# Patient Record
Sex: Female | Born: 1980 | Race: White | Hispanic: No | Marital: Married
Health system: Southern US, Community
[De-identification: ages and names within clinical notes are randomized; demographics above are authoritative.]

## PROBLEM LIST (undated history)

## (undated) DIAGNOSIS — F419 Anxiety disorder, unspecified: Secondary | ICD-10-CM

## (undated) DIAGNOSIS — F909 Attention-deficit hyperactivity disorder, unspecified type: Secondary | ICD-10-CM

## (undated) HISTORY — PX: TONSILLECTOMY: SUR1361

## (undated) HISTORY — PX: ABLATION: SHX5711

## (undated) HISTORY — PX: CHOLECYSTECTOMY: SHX55

## (undated) HISTORY — DX: Attention-deficit hyperactivity disorder, unspecified type: F90.9

## (undated) HISTORY — DX: Anxiety disorder, unspecified: F41.9

## (undated) HISTORY — PX: NASAL SINUS SURGERY: SHX719

---

## 2016-07-20 DIAGNOSIS — F419 Anxiety disorder, unspecified: Secondary | ICD-10-CM | POA: Insufficient documentation

## 2016-07-20 DIAGNOSIS — E041 Nontoxic single thyroid nodule: Secondary | ICD-10-CM | POA: Insufficient documentation

## 2017-08-18 ENCOUNTER — Ambulatory Visit (INDEPENDENT_AMBULATORY_CARE_PROVIDER_SITE_OTHER): Payer: Commercial Managed Care - PPO

## 2017-08-18 ENCOUNTER — Encounter: Payer: Self-pay | Admitting: Sports Medicine

## 2017-08-18 ENCOUNTER — Ambulatory Visit (INDEPENDENT_AMBULATORY_CARE_PROVIDER_SITE_OTHER): Payer: Commercial Managed Care - PPO | Admitting: Sports Medicine

## 2017-08-18 DIAGNOSIS — S93501A Unspecified sprain of right great toe, initial encounter: Secondary | ICD-10-CM

## 2017-08-18 DIAGNOSIS — M79674 Pain in right toe(s): Secondary | ICD-10-CM

## 2017-08-18 DIAGNOSIS — S93509A Unspecified sprain of unspecified toe(s), initial encounter: Secondary | ICD-10-CM

## 2017-08-18 NOTE — Progress Notes (Signed)
Subjective: Rhonda ShearerMelanie Hurst is a 37 y.o. female patient who presents to office for evaluation of righ foot pain. Patient states that when she called for the appointment her toe is hurting but it is not hurting anymore.  States that the whole entire first toe on the right foot was hurting for 1 week and was sharp when she would stand or try to do any type of running patient reported pain at that time 6 out of 10 but no longer hurts has treated the area with Advil icing resting taping and Epson salt soaks.  Patient states that she had this to flare up twice this year last time earlier this spring when she was attempting to run states that since she has cut back and has changed her shoes and went up in one size which has given her some improvement without recurrence of pain.  Patient states that the first time she has ran was on last week and she ran without any pain but wanted to have her big toe checked because she wanted to see if there is anything wrong.  Patient denies any other pedal complaints. Denies injury/trip/fall/sprain/any causative factors.   Review of Systems  Musculoskeletal: Positive for joint pain.  All other systems reviewed and are negative.    There are no active problems to display for this patient.   No current outpatient medications on file prior to visit.   No current facility-administered medications on file prior to visit.     Allergies  Allergen Reactions  . Sulfa Antibiotics   . Sulfamethoxazole Rash and Hives    Objective:  General: Alert and oriented x3 in no acute distress  Dermatology: No open lesions bilateral lower extremities, no webspace macerations, no ecchymosis bilateral, all nails x 10 are well manicured.  Vascular: Dorsalis Pedis and Posterior Tibial pedal pulses palpable, Capillary Fill Time 3 seconds,(+) pedal hair growth bilateral, no edema bilateral lower extremities, Temperature gradient within normal limits.  Neurology: Michaell CowingGross sensation intact  via light touch bilateral.  Musculoskeletal: No reproducible tenderness with palpation at right great toe,No pain with calf compression bilateral. There is decreased ankle rom with knee extending  vs flexed resembling gastroc equnius bilateral, Subtalar joint range of motion is within normal limits, there is no 1st ray hypermobility noted bilateral, early decreased 1st MPJ rom Right>Left with functional limitus and expenses noted on weightbearing exam. Strength within normal limits in all groups bilateral.   Gait:Non- Antalgic gait  Xrays  Right foot   Impression: Normal osseous mineralization there is bipartite sesamoid no other acute findings noted.  Assessment and Plan: Problem List Items Addressed This Visit    None    Visit Diagnoses    Toe pain, right    -  Primary   Relevant Orders   DG Foot Complete Right   Toe sprain, initial encounter           -Complete examination performed -Xrays reviewed -Discussed treatement options for turf toe -Recommend patient to avoid strenuous activities that would cause aggravation to the toe and for running to run in smaller increments and to cross train during the week to avoid reinjury of the big toe joint -Provided toe caps for patient to use when running to cushion first toes and advised patient to use good supportive shoes if Pain continues advised patient to look into getting super feet orthotics and to return to office for further evaluation -May continue with Advil as needed icing Epson salt and strapping or taping -Patient  to return to office as needed or sooner if condition worsens.  Asencion Islam, DPM

## 2017-08-18 NOTE — Progress Notes (Signed)
   Subjective:    Patient ID: Rhonda ShearerMelanie Hurst, female    DOB: [birth year], 37 y.o.   MRN: [id number]  HPI    Review of Systems  Musculoskeletal: Positive for arthralgias, joint swelling and myalgias.  All other systems reviewed and are negative.      Objective:   Physical Exam        Assessment & Plan:

## 2017-08-18 NOTE — Patient Instructions (Signed)
Turf Toe Turf toe is an injury that affects the joint at the base of the big toe. It occurs when the toe is bent upward by force and extended beyond its normal limits (hyperextension). The joint of the big toe is surrounded by tissues (ligaments and tendons) that help to keep it in place. If any of these tissues are damaged, turf toe may result. Turf toe is a common sports injury. It can be mild if the tissue was stretched. It may be more severe if the tissue was partially or completely torn. Early treatment usually results in good recovery. In some cases, a person may continue to have some pain, joint stiffness, and reduced ability to push off from the affected foot. What are the causes? This injury is caused by extreme upward bending of the big toe joint. It can occur when:  Your toe is pressed flat to the ground and your heel is raised while you push off forcefully with the front of your foot. For example, this could happen when you begin a sprint.  You push off on the ball of your foot repeatedly while running or jumping, especially on hard surfaces such as a basketball court.  You jam your toe from a force pushing into the toe.  What increases the risk? This injury is more likely to occur in people:  Who wear flexible shoes that do not offer good support while running or jumping.  Who participate in activities or sports that involve running and jumping on turf or hard surfaces, such as: ? Soccer. ? Football. ? Basketball. ? Volleyball. ? Gymnastics. ? Dancing. ? Wrestling.  What are the signs or symptoms? Symptoms of this condition include:  Pain at the base of the toe.  Swelling at the base of the toe.  Stiffness.  Limited movement of the toe.  Bruising.  If turf toe is the result of a direct injury, symptoms may appear suddenly. If the condition is due to repetitive movements, such as running and jumping, the symptoms may develop gradually and worsen over time. How is  this diagnosed? This condition may be diagnosed based on your symptoms, your medical history, and a physical exam of your foot. During the exam, the health care provider will check the range of motion of your toe by moving it up and down and from side to side. You may also have tests to confirm the diagnosis, such as:  X-rays to rule out bone problems, such as a fracture, a chip, or bones that are out of alignment.  MRI to view the soft tissue and cartilage in your toe. This will help to determine how severe your injury is.  How is this treated? Treatment for this condition depends on the severity of the injury. Treatment may include:  Rest, ice, compression, and elevation. This is often called the RICE strategy. It may be recommended if the injury is mild. Your health care provider may also restrict movement of your toe by taping it to the smaller toes.  Wearing a walking boot or a cast. For more serious turf toe, you may need to wear a walking boot for about one week. This will keep your toe from moving (immobilization). If you have severe turf toe, you may need to wear a cast or a walking boot for several weeks.  Over-the-counter medicines to relieve pain.  Physical therapy. Stretching and strengthening exercises can help to reduce or prevent joint stiffness in your toe.  Surgery. In rare cases, surgery  may be needed for a severe injury if pain does not go away.  Follow these instructions at home: Managing pain, stiffness, and swelling  If directed, apply ice to the injured area: ? Put ice in a plastic bag. ? Place a towel between your skin and the bag. ? Leave the ice on for 20 minutes, 2-3 times per day.  Move your toes often to avoid stiffness and to lessen swelling.  Wear an elastic compression bandage to help prevent or lessen swelling.  Raise (elevate) your foot above the level of your heart while you are sitting or lying down.  If you have a walking boot, wear it as told  by your health care provider.  Do not use the injured foot to support (bear) your body weight until your health care provider says that you can. Use crutches as told by your health care provider. Medicines  Take over-the-counter and prescription medicines only as told by your health care provider.  Do not drive or operate heavy machinery while taking prescription pain medicine. If you have a cast:  Do not put pressure on any part of the cast until it is fully hardened. This may take several hours.  Do not stick anything inside the cast to scratch your skin. Doing that increases your risk of infection.  Check the skin around the cast every day. Report any concerns to your health care provider. You may put lotion on dry skin around the edges of the cast. Do not apply lotion to the skin underneath the cast.  Do not let your cast get wet if it is not waterproof.  Keep the cast clean. General instructions  If your cast is not waterproof, cover it with a watertight plastic bag when you take a bath or a shower.  Return to your normal activities as told by your health care provider. Ask your health care provider what activities are safe for you.  Switch to less-flexible, more supportive footwear as told by your health care provider. Rigid shoe inserts (orthotics) can also reduce stress on your toes and improve stability.  Keep all follow-up visits as told by your health care provider. This is important. Contact a health care provider if:  You have new bruising or swelling in your toe.  The pain in your toe gets worse.  Your pain medicine is not helping. Get help right away if:  Your cast or walking boot becomes loose or damaged.  Your pain becomes severe.  Your toe becomes numb or changes color.  Your toe joint feels unstable or is unable to bear any weight. This information is not intended to replace advice given to you by your health care provider. Make sure you discuss any  questions you have with your health care provider. Document Released: 06/18/2001 Document Revised: 08/30/2015 Document Reviewed: 07/09/2014 Elsevier Interactive Patient Education  2018 [employer]  Turf Toe Rehab Ask your health care provider which exercises are safe for you. Do exercises exactly as told by your health care provider and adjust them as directed. It is normal to feel mild stretching, pulling, tightness, or discomfort as you do these exercises, but you should stop right away if you feel sudden pain or your pain gets worse. Do not begin these exercises until told by your health care provider. Stretching and range of motion exercises These exercises warm up your muscles and joints and improve the movement and flexibility of your foot. These exercises also help to relieve pain. Exercise A: Toe  flexion  1. Sit with your left / rightleg crossed over your opposite knee. 2. Gently pull your big toe toward the bottom of your foot. You should feel a stretch on the top of your toe and foot. 3. Hold this position for __________ seconds. 4. Release your toe and return to the starting position. Repeat __________ times. Complete this exercise __________ times a day. Exercise B: Gastroc, standing  1. Stand with your hands against a wall. 2. Extend your left / right leg behind you, and bend your front knee slightly. Your heels should be on the floor. 3. Keeping your heels on the floor, keep your back knee straight and shift your weight toward the wall. You should feel a gentle stretch in the back of your lower leg (calf). 4. Hold this position for __________ seconds. 5. Return to the starting position. Repeat __________ times. Complete this exercise __________ times a day. Exercise C: Soleus, standing 1. Stand with your hands against a wall. 2. Extend your left / right leg behind you, and bend your front knee slightly. Your heels should be on the floor. 3. Keeping your heels on the floor,  bend your back knee and shift your weight slightly over your back leg. You should feel a gentle stretch deep in your calf. 4. Hold this position for __________ seconds. 5. Return to the starting position. Repeat __________ times. Complete this exercise __________ times a day. Strengthening exercises These exercises build strength and endurance in your foot. Endurance is the ability to use your muscles for a long time, even after they get tired. Exercise D: Towel curls ( toe flexors) 1. Sit in a chair on a non-carpeted surface, and put your feet on the floor. 2. Place a towel in front of your feet. If told by your health care provider, add __________ to the end of the towel. 3. Keeping your heel on the floor, put your left / right foot on the towel. 4. Pull the towel toward your heel by grabbing the towel with your left / right toes and curling them under. Keep your heel on the floor while you do this. 5. Let your toes relax. 6. Grab the towel again. Keep going until the towel is completely underneath your foot. Repeat __________ times. Complete this exercise __________ times a day. Exercise E: Arch lifts ( foot intrinsics) 1. Sit in a chair with your feet flat on the floor. 2. Keeping your big toe and your heel on the floor, lift only your arch, which is on the inner edge of your left / right foot. Do not move your knees or scrunch your toes. This is a small movement. 3. Hold this position for __________ seconds. 4. Slowly return to the starting position. Repeat __________ times. Complete this exercise __________ times a day. This information is not intended to replace advice given to you by your health care provider. Make sure you discuss any questions you have with your health care provider. Document Released: 12/27/2004 Document Revised: 09/03/2015 Document Reviewed: 11/10/2014 Elsevier Interactive Patient Education  2018 [employer].

## 2017-08-24 ENCOUNTER — Other Ambulatory Visit: Payer: Self-pay | Admitting: Sports Medicine

## 2017-08-24 DIAGNOSIS — S93509A Unspecified sprain of unspecified toe(s), initial encounter: Secondary | ICD-10-CM

## 2017-08-24 DIAGNOSIS — M79674 Pain in right toe(s): Secondary | ICD-10-CM

## 2018-10-30 ENCOUNTER — Ambulatory Visit (INDEPENDENT_AMBULATORY_CARE_PROVIDER_SITE_OTHER): Payer: Commercial Managed Care - PPO | Admitting: Cardiology

## 2018-10-30 ENCOUNTER — Encounter: Payer: Self-pay | Admitting: Cardiology

## 2018-10-30 ENCOUNTER — Other Ambulatory Visit: Payer: Self-pay

## 2018-10-30 VITALS — BP 102/78 | HR 69 | Ht 61.0 in | Wt 137.0 lb

## 2018-10-30 DIAGNOSIS — R0602 Shortness of breath: Secondary | ICD-10-CM

## 2018-10-30 DIAGNOSIS — E782 Mixed hyperlipidemia: Secondary | ICD-10-CM

## 2018-10-30 DIAGNOSIS — R0600 Dyspnea, unspecified: Secondary | ICD-10-CM | POA: Insufficient documentation

## 2018-10-30 NOTE — Progress Notes (Signed)
Cardiology Office Note:    Date:  10/30/2018   ID:  Rhonda Hurst, DOB 1980-03-25, MRN [id number]  PCP:  Lind Guest, NP  Cardiologist:  No primary care provider on file.  Electrophysiologist:  None   Referring MD: Lind Guest, NP   Patient referred by primary care provider for shortness of breath.  History of Present Illness:    Rhonda Hurst is a 38 y.o. female with a hx of anxiety presents to be evaluated for shortness of breath.  Patient reports that over the last several months she has been experiencing worsening shortness of breath.  She states in the beginning of this she is concerned that this was a COVID-19 virus therefore she went to the ED to be evaluated.  In the ED at [facility] her testing came back negative for Covid with chest x-ray being normal.  But she still experiencing the shortness of breath.  Given his symptoms her PCP recommended she come and see a cardiologist.  She denies any chest pain, hypertension, dizziness and fatigue.  Past Medical History:  Diagnosis Date  . Anxiety     History reviewed. No pertinent surgical history.  Current Medications: Current Meds  Medication Sig  . busPIRone (BUSPAR) 5 MG tablet Take 1 tablet by mouth daily.  . hydrOXYzine (ATARAX/VISTARIL) 25 MG tablet Take 1 tablet by mouth daily.  Marland Kitchen loratadine (CLARITIN) 10 MG tablet Take 10 mg by mouth daily.  . Melatonin 10 MG TABS Take 1 tablet by mouth daily.     Allergies:   Sulfa antibiotics and Sulfamethoxazole   Social History   Socioeconomic History  . Marital status: Married    Spouse name: Not on file  . Number of children: Not on file  . Years of education: Not on file  . Highest education level: Not on file  Occupational History  . Not on file  Social Needs  . Financial resource strain: Not on file  . Food insecurity    Worry: Not on file    Inability: Not on file  . Transportation needs    Medical: Not on file    Non-medical: Not on file   Tobacco Use  . Smoking status: Former Research scientist (life sciences)  . Smokeless tobacco: Never Used  Substance and Sexual Activity  . Alcohol use: Not on file  . Drug use: Not on file  . Sexual activity: Not on file  Lifestyle  . Physical activity    Days per week: Not on file    Minutes per session: Not on file  . Stress: Not on file  Relationships  . Social Herbalist on phone: Not on file    Gets together: Not on file    Attends religious service: Not on file    Active member of club or organization: Not on file    Attends meetings of clubs or organizations: Not on file    Relationship status: Not on file  Other Topics Concern  . Not on file  Social History Narrative  . Not on file     Family History: The patient's family history includes Colon cancer in her father; Hyperlipidemia in her mother.  ROS:   Review of Systems  Constitution: Negative for decreased appetite, fever and weight gain.  HENT: Negative for congestion, ear discharge, hoarse voice and sore throat.   Eyes: Negative for discharge, redness, vision loss in right eye and visual halos.  Cardiovascular: Negative for chest pain, dyspnea on exertion, leg swelling,  orthopnea and palpitations.  Respiratory: Reports shortness of breath.  Negative for cough, hemoptysis,  and snoring.   Endocrine: Negative for heat intolerance and polyphagia.  Hematologic/Lymphatic: Negative for bleeding problem. Does not bruise/bleed easily.  Skin: Negative for flushing, nail changes, rash and suspicious lesions.  Musculoskeletal: Negative for arthritis, joint pain, muscle cramps, myalgias, neck pain and stiffness.  Gastrointestinal: Negative for abdominal pain, bowel incontinence, diarrhea and excessive appetite.  Genitourinary: Negative for decreased libido, genital sores and incomplete emptying.  Neurological: Negative for brief paralysis, focal weakness, headaches and loss of balance.  Psychiatric/Behavioral: Negative for altered mental  status, depression and suicidal ideas.  Allergic/Immunologic: Negative for HIV exposure and persistent infections.    EKGs/Labs/Other Studies Reviewed:    The following studies were reviewed today:  EKG:  The ekg ordered today demonstrates sinus rhythm, heart rate 69 bpm, nonspecific ST changes.  No prior EKG to compare  Chest x-ray performed on September 07, 2018 at the [facility] reviewed by me no evidence of pleural effusion, or consolidation.  Essentially unremarkable/normal chest x-ray.  Recent Labs: WBC 10/27/2018: Hemoglobin 13.6, hematocrit 40.7, MCV 85, platelet 269 Chemistry: Sodium 138, potassium 3.6, chloride 102, bicarb 31, creatinine 0.80, BUN 11, will be A1c 4.7, glucose 90, AST 25, ALT 16, alk phos 78 Recent Lipid Panel Total cholesterol 2 5, triglyceride 115, LDL 117, HDL 65  Physical Exam:    VS:  BP 102/78 (BP Location: Left Arm)   Pulse 69   Ht '5\' 1"'$  (1.549 m)   Wt 137 lb (62.1 kg)   SpO2 98%   BMI 25.89 kg/m     Wt Readings from Last 3 Encounters:  10/30/18 137 lb (62.1 kg)     GEN: Well nourished, well developed in no acute distress HEENT: Normal NECK: No JVD; No carotid bruits LYMPHATICS: No lymphadenopathy CARDIAC: S1S2 noted,RRR, no murmurs, rubs, gallops RESPIRATORY:  Clear to auscultation without rales, wheezing or rhonchi  ABDOMEN: Soft, non-tender, non-distended, +bowel sounds, no guarding. EXTREMITIES: No edema, No cyanosis, no clubbing MUSCULOSKELETAL:  No edema; No deformity  SKIN: Warm and dry NEUROLOGIC:  Alert and oriented x 3, non-focal PSYCHIATRIC:  Normal affect, good insight  ASSESSMENT:    No diagnosis found. PLAN:    In order of problems listed above:  1.  I am concerned about her worsening shortness of breath.  Therefore at this time I am going to order thoracic echocardiogram to assess for RV/LV function, and any structural abnormalities.  2.  In addition have ordered pulmonary function test for the shortness of  breath.  I did recommend she also to see pulmonologist.  3.  For now she can do activities as tolerated.  4.  Her lipid panel from [facility] performed on 10/27/2018 was reviewed total cholesterol slightly elevated as well as LDL.  At this time the patient prefers lifestyle modification with diet changes.  We will repeat this in 3 months.  5.  Her anxiety may be playing a role here.  She just had a new medication added to her regimen.  We will continue to monitor to see if there is any improvement.  6.  I do not believe that this is acute coronary syndrome.  However we will continue to monitor and reassess for any further testing.  The patient is in agreement with the above plan. The patient left the office in stable condition.  The patient will follow up in 3 months or sooner as needed   Medication Adjustments/Labs and  Tests Ordered: Current medicines are reviewed at length with the patient today.  Concerns regarding medicines are outlined above.  No orders of the defined types were placed in this encounter.  No orders of the defined types were placed in this encounter.   There are no Patient Instructions on file for this visit.   Adopting a Healthy Lifestyle.  Know what a healthy weight is for you (roughly BMI <25) and aim to maintain this   Aim for 7+ servings of fruits and vegetables daily   65-80+ fluid ounces of water or unsweet tea for healthy kidneys   Limit to max 1 drink of alcohol per day; avoid smoking/tobacco   Limit animal fats in diet for cholesterol and heart health - choose grass fed whenever available   Avoid highly processed foods, and foods high in saturated/trans fats   Aim for low stress - take time to unwind and care for your mental health   Aim for 150 min of moderate intensity exercise weekly for heart health, and weights twice weekly for bone health   Aim for 7-9 hours of sleep daily   When it comes to diets, agreement about the perfect plan  isnt easy to find, even among the experts. Experts at the [employer] developed an idea known as the Healthy Eating Plate. Just imagine a plate divided into logical, healthy portions.   The emphasis is on diet quality:   Load up on vegetables and fruits - one-half of your plate: Aim for color and variety, and remember that potatoes dont count.   Go for whole grains - one-quarter of your plate: Whole wheat, barley, wheat berries, quinoa, oats, brown rice, and foods made with them. If you want pasta, go with whole wheat pasta.   Protein power - one-quarter of your plate: Fish, chicken, beans, and nuts are all healthy, versatile protein sources. Limit red meat.   The diet, however, does go beyond the plate, offering a few other suggestions.   Use healthy plant oils, such as olive, canola, soy, corn, sunflower and peanut. Check the labels, and avoid partially hydrogenated oil, which have unhealthy trans fats.   If youre thirsty, drink water. Coffee and tea are good in moderation, but skip sugary drinks and limit milk and dairy products to one or two daily servings.   The type of carbohydrate in the diet is more important than the amount. Some sources of carbohydrates, such as vegetables, fruits, whole grains, and beans-are healthier than others.   Finally, stay active  Signed, Berniece Salines, DO  10/30/2018 9:25 AM    [facility]

## 2018-10-30 NOTE — Patient Instructions (Signed)
Medication Instructions:  Your physician recommends that you continue on your current medications as directed. Please refer to the Current Medication list given to you today.  *If you need a refill on your cardiac medications before your next appointment, please call your pharmacy*  Lab Work: None If you have labs (blood work) drawn today and your tests are completely normal, you will receive your results only by: Marland Kitchen MyChart Message (if you have MyChart) OR . A paper copy in the mail If you have any lab test that is abnormal or we need to change your treatment, we will call you to review the results.  Testing/Procedures: Your physician has requested that you have an echocardiogram. Echocardiography is a painless test that uses sound waves to create images of your heart. It provides your doctor with information about the size and shape of your heart and how well your heart's chambers and valves are working. This procedure takes approximately one hour. There are no restrictions for this procedure.  Your physician has recommended that you have a pulmonary function test. Pulmonary Function Tests are a group of tests that measure how well air moves in and out of your lungs.   Follow-Up: At [facility], you and your health needs are our priority.  As part of our continuing mission to provide you with exceptional heart care, we have created designated Provider Care Teams.  These Care Teams include your primary Cardiologist (physician) and Advanced Practice Providers (APPs -  Physician Assistants and Nurse Practitioners) who all work together to provide you with the care you need, when you need it.  Your next appointment:   3 months  The format for your next appointment:   In Person  Provider:   Berniece Salines, DO  Other Instructions

## 2018-11-07 DIAGNOSIS — R011 Cardiac murmur, unspecified: Secondary | ICD-10-CM

## 2019-01-25 ENCOUNTER — Ambulatory Visit (INDEPENDENT_AMBULATORY_CARE_PROVIDER_SITE_OTHER): Payer: Commercial Managed Care - PPO | Admitting: Cardiology

## 2019-01-25 ENCOUNTER — Other Ambulatory Visit: Payer: Self-pay

## 2019-01-25 ENCOUNTER — Encounter: Payer: Self-pay | Admitting: Cardiology

## 2019-01-25 VITALS — BP 100/80 | HR 78 | Ht 61.0 in | Wt 133.0 lb

## 2019-01-25 DIAGNOSIS — R0602 Shortness of breath: Secondary | ICD-10-CM

## 2019-01-25 DIAGNOSIS — F419 Anxiety disorder, unspecified: Secondary | ICD-10-CM | POA: Diagnosis not present

## 2019-01-25 NOTE — Progress Notes (Signed)
Cardiology Office Note:    Date:  01/25/2019   ID:  Rhonda Hurst, DOB 1980/04/15, MRN [id number]  PCP:  Lind Guest, NP  Cardiologist:  Berniece Salines, DO  Electrophysiologist:  None   Referring MD: Lind Guest, NP   Chief Complaint  Patient presents with  . Follow-up    History of Present Illness:    Rhonda Hurst is a 39 y.o. female with a hx of hyperlipidemia, anxiety who presented initially on October 30, 2018 to be evaluated for shortness of breath.  During her initial presentation she denied chest pain, dizziness and fatigue.  She had chest x-ray at [facility] prior to her visit which was reported as normal.  The conclusion her visit we discussed getting a transthoracic echocardiogram to assess RV/LV function as well as following up with her PCP to optimize her anxiety medications.  She is here for follow-up visit today she looks well and she tells me that her symptoms have significantly improved since she has been up titrating her antianxiety's.  She denies any chest pain, shortness of breath today.  Past Medical History:  Diagnosis Date  . Anxiety     No past surgical history on file.  Current Medications: Current Meds  Medication Sig  . busPIRone (BUSPAR) 5 MG tablet Take 1 tablet by mouth daily.  . hydrOXYzine (ATARAX/VISTARIL) 25 MG tablet Take 1 tablet by mouth daily.  Marland Kitchen lisdexamfetamine (VYVANSE) 10 MG capsule Take 10 mg by mouth daily.  Marland Kitchen loratadine (CLARITIN) 10 MG tablet Take 10 mg by mouth daily.  . Melatonin 10 MG TABS Take 1 tablet by mouth daily.     Allergies:   Sulfa antibiotics and Sulfamethoxazole   Social History   Socioeconomic History  . Marital status: Married    Spouse name: Not on file  . Number of children: Not on file  . Years of education: Not on file  . Highest education level: Not on file  Occupational History  . Not on file  Tobacco Use  . Smoking status: Former Research scientist (life sciences)  . Smokeless tobacco: Never Used  Substance and  Sexual Activity  . Alcohol use: Not on file  . Drug use: Not on file  . Sexual activity: Not on file  Other Topics Concern  . Not on file  Social History Narrative  . Not on file   Social Determinants of Health   Financial Resource Strain:   . Difficulty of Paying Living Expenses: Not on file  Food Insecurity:   . Worried About Charity fundraiser in the Last Year: Not on file  . Ran Out of Food in the Last Year: Not on file  Transportation Needs:   . Lack of Transportation (Medical): Not on file  . Lack of Transportation (Non-Medical): Not on file  Physical Activity:   . Days of Exercise per Week: Not on file  . Minutes of Exercise per Session: Not on file  Stress:   . Feeling of Stress : Not on file  Social Connections:   . Frequency of Communication with Friends and Family: Not on file  . Frequency of Social Gatherings with Friends and Family: Not on file  . Attends Religious Services: Not on file  . Active Member of Clubs or Organizations: Not on file  . Attends Archivist Meetings: Not on file  . Marital Status: Not on file     Family History: The patient's family history includes Colon cancer in her father; Hyperlipidemia in her  mother.  ROS:   Review of Systems  Constitution: Negative for decreased appetite, fever and weight gain.  HENT: Negative for congestion, ear discharge, hoarse voice and sore throat.   Eyes: Negative for discharge, redness, vision loss in right eye and visual halos.  Cardiovascular: Negative for chest pain, dyspnea on exertion, leg swelling, orthopnea and palpitations.  Respiratory: Negative for cough, hemoptysis, shortness of breath and snoring.   Endocrine: Negative for heat intolerance and polyphagia.  Hematologic/Lymphatic: Negative for bleeding problem. Does not bruise/bleed easily.  Skin: Negative for flushing, nail changes, rash and suspicious lesions.  Musculoskeletal: Negative for arthritis, joint pain, muscle cramps,  myalgias, neck pain and stiffness.  Gastrointestinal: Negative for abdominal pain, bowel incontinence, diarrhea and excessive appetite.  Genitourinary: Negative for decreased libido, genital sores and incomplete emptying.  Neurological: Negative for brief paralysis, focal weakness, headaches and loss of balance.  Psychiatric/Behavioral: Negative for altered mental status, depression and suicidal ideas.  Allergic/Immunologic: Negative for HIV exposure and persistent infections.    EKGs/Labs/Other Studies Reviewed:    The following studies were reviewed today:   EKG: None today  Transthoracic echocardiogram done on November 07, 2018 at [facility] which was read by me was a normal 2D and Doppler study.  Recent Labs: No results found for requested labs within last 8760 hours.  Recent Lipid Panel No results found for: CHOL, TRIG, HDL, CHOLHDL, VLDL, LDLCALC, LDLDIRECT  Physical Exam:    VS:  BP 100/80 (BP Location: Right Arm, Patient Position: Sitting, Cuff Size: Normal)   Pulse 78   Ht 5\' 1"  (1.549 m)   Wt 133 lb (60.3 kg)   SpO2 98%   BMI 25.13 kg/m     Wt Readings from Last 3 Encounters:  01/25/19 133 lb (60.3 kg)  10/30/18 137 lb (62.1 kg)     GEN: Well nourished, well developed in no acute distress HEENT: Normal NECK: No JVD; No carotid bruits LYMPHATICS: No lymphadenopathy CARDIAC: S1S2 noted,RRR, no murmurs, rubs, gallops RESPIRATORY:  Clear to auscultation without rales, wheezing or rhonchi  ABDOMEN: Soft, non-tender, non-distended, +bowel sounds, no guarding. EXTREMITIES: No edema, No cyanosis, no clubbing MUSCULOSKELETAL:  No edema; No deformity  SKIN: Warm and dry NEUROLOGIC:  Alert and oriented x 3, non-focal PSYCHIATRIC:  Normal affect, good insight  ASSESSMENT:    No diagnosis found. PLAN:    1.  She is very happy with her improvement.  I discussed her echo results with her at this point from a cardiovascular standpoint there is no further need for  any further testing.  I discussed with patient and she will see me on a as needed basis if any cardiovascular symptoms recur.  The patient is in agreement with the above plan. The patient left the office in stable condition.  The patient will follow up as needed.   Medication Adjustments/Labs and Tests Ordered: Current medicines are reviewed at length with the patient today.  Concerns regarding medicines are outlined above.  No orders of the defined types were placed in this encounter.  No orders of the defined types were placed in this encounter.   There are no Patient Instructions on file for this visit.   Adopting a Healthy Lifestyle.  Know what a healthy weight is for you (roughly BMI <25) and aim to maintain this   Aim for 7+ servings of fruits and vegetables daily   65-80+ fluid ounces of water or unsweet tea for healthy kidneys   Limit to max 1 drink of  alcohol per day; avoid smoking/tobacco   Limit animal fats in diet for cholesterol and heart health - choose grass fed whenever available   Avoid highly processed foods, and foods high in saturated/trans fats   Aim for low stress - take time to unwind and care for your mental health   Aim for 150 min of moderate intensity exercise weekly for heart health, and weights twice weekly for bone health   Aim for 7-9 hours of sleep daily   When it comes to diets, agreement about the perfect plan isnt easy to find, even among the experts. Experts at the [facility] [employer] developed an idea known as the Healthy Eating Plate. Just imagine a plate divided into logical, healthy portions.   The emphasis is on diet quality:   Load up on vegetables and fruits - one-half of your plate: Aim for color and variety, and remember that potatoes dont count.   Go for whole grains - one-quarter of your plate: Whole wheat, barley, wheat berries, quinoa, oats, brown rice, and foods made with them. If you want pasta, go with whole  wheat pasta.   Protein power - one-quarter of your plate: Fish, chicken, beans, and nuts are all healthy, versatile protein sources. Limit red meat.   The diet, however, does go beyond the plate, offering a few other suggestions.   Use healthy plant oils, such as olive, canola, soy, corn, sunflower and peanut. Check the labels, and avoid partially hydrogenated oil, which have unhealthy trans fats.   If youre thirsty, drink water. Coffee and tea are good in moderation, but skip sugary drinks and limit milk and dairy products to one or two daily servings.   The type of carbohydrate in the diet is more important than the amount. Some sources of carbohydrates, such as vegetables, fruits, whole grains, and beans-are healthier than others.   Finally, stay active  Signed, Thomasene Ripple, DO  01/25/2019 3:09 PM    [facility]

## 2019-01-25 NOTE — Patient Instructions (Signed)
Medication Instructions:  Your physician recommends that you continue on your current medications as directed. Please refer to the Current Medication list given to you today.  *If you need a refill on your cardiac medications before your next appointment, please call your pharmacy*  Lab Work: NONE If you have labs (blood work) drawn today and your tests are completely normal, you will receive your results only by: Marland Kitchen MyChart Message (if you have MyChart) OR . A paper copy in the mail If you have any lab test that is abnormal or we need to change your treatment, we will call you to review the results.  Testing/Procedures: NONE  Follow-Up: At [facility], you and your health needs are our priority.  As part of our continuing mission to provide you with exceptional heart care, we have created designated Provider Care Teams.  These Care Teams include your primary Cardiologist (physician) and Advanced Practice Providers (APPs -  Physician Assistants and Nurse Practitioners) who all work together to provide you with the care you need, when you need it.  Your next appointment:   Please follow up with [place] as needed otherwise go to your PCP  The format for your next appointment:     Provider:     Dr. Servando Salina

## 2020-01-21 ENCOUNTER — Ambulatory Visit: Payer: Commercial Managed Care - PPO | Admitting: Sports Medicine

## 2020-02-05 ENCOUNTER — Other Ambulatory Visit: Payer: Self-pay | Admitting: Sports Medicine

## 2020-02-05 ENCOUNTER — Ambulatory Visit (INDEPENDENT_AMBULATORY_CARE_PROVIDER_SITE_OTHER): Payer: Commercial Managed Care - PPO

## 2020-02-05 ENCOUNTER — Ambulatory Visit: Payer: Commercial Managed Care - PPO | Admitting: Sports Medicine

## 2020-02-05 ENCOUNTER — Encounter: Payer: Self-pay | Admitting: Sports Medicine

## 2020-02-05 ENCOUNTER — Other Ambulatory Visit: Payer: Self-pay

## 2020-02-05 ENCOUNTER — Other Ambulatory Visit: Payer: Self-pay | Admitting: *Deleted

## 2020-02-05 DIAGNOSIS — M792 Neuralgia and neuritis, unspecified: Secondary | ICD-10-CM | POA: Diagnosis not present

## 2020-02-05 DIAGNOSIS — G5752 Tarsal tunnel syndrome, left lower limb: Secondary | ICD-10-CM

## 2020-02-05 DIAGNOSIS — M722 Plantar fascial fibromatosis: Secondary | ICD-10-CM

## 2020-02-05 DIAGNOSIS — M7752 Other enthesopathy of left foot: Secondary | ICD-10-CM

## 2020-02-05 DIAGNOSIS — M216X2 Other acquired deformities of left foot: Secondary | ICD-10-CM

## 2020-02-05 DIAGNOSIS — M779 Enthesopathy, unspecified: Secondary | ICD-10-CM

## 2020-02-05 DIAGNOSIS — M7750 Other enthesopathy of unspecified foot: Secondary | ICD-10-CM

## 2020-02-05 NOTE — Progress Notes (Signed)
Subjective: Rhonda Hurst is a 40 y.o. female patient presents to office with complaint of pain since last month that started after a run on December 4 at the medial foot and arch she reports that she ended up seeing Dr. Deberah Castle at [facility] who treated her and put her in a cast for 2 weeks.  Patient reports that pain feels better but after she was taken out of the cast they gave her insole and she still has a little pain in the arch reports that it feels like a pulling sensation is better that comes and goes states that the sharp shooting pains that she had at the inside of the ankle has resolved but is still concerned because she still has pain especially worse with walking and standing especially if she is on her feet or wears a certain styles of shoes.  Reports that when she is barefooted or has no shoes on does not have pain but with certain shoes think that press at the medial foot the pain increases.  Patient denies nausea vomiting fever chills or any other constitutional symptoms this time.   Patient Active Problem List   Diagnosis Date Noted  . Dyspnea 10/30/2018  . Anxiety 07/20/2016  . Thyroid nodule 07/20/2016    Current Outpatient Medications on File Prior to Visit  Medication Sig Dispense Refill  . busPIRone (BUSPAR) 5 MG tablet Take 1 tablet by mouth daily.    . hydrOXYzine (ATARAX/VISTARIL) 25 MG tablet Take 1 tablet by mouth daily.    Marland Kitchen lisdexamfetamine (VYVANSE) 10 MG capsule Take 10 mg by mouth daily.    Marland Kitchen loratadine (CLARITIN) 10 MG tablet Take 10 mg by mouth daily.    . Melatonin 10 MG TABS Take 1 tablet by mouth daily.     No current facility-administered medications on file prior to visit.    Allergies  Allergen Reactions  . Sulfa Antibiotics   . Sulfamethoxazole Rash and Hives    Objective: Physical Exam General: The patient is alert and oriented x3 in no acute distress.  Dermatology: Skin is warm, dry and supple bilateral lower extremities. Nails 1-10  are normal. There is no erythema, edema, no eccymosis, no open lesions present. Integument is otherwise unremarkable.  Vascular: Dorsalis Pedis pulse and Posterior Tibial pulse are 2/4 bilateral. Capillary fill time is immediate to all digits.  Neurological: Grossly intact to light touch bilateral.  Tinel's sign is negative at left medial foot over the tibial nerve.  Musculoskeletal: There is mild tenderness palpation to the mid arch of the left foot no pain at the plantar fascial insertion, range of motion within normal limits out any major pain however subjectively when her left foot is everted there is a small pulling sensation noted in the arch.  No distinct pain along the posterior tibial tendon course and no pain along the posterior tibial nerve however subjectively patient used to have pain at this area.  There is mild reduced ankle joint range of motion consistent with equinus.   Xray, left foot: Normal osseous mineralization. Joint spaces preserved. No fracture/dislocation/boney destruction.  No calcaneal spurs. No other soft tissue abnormalities or radiopaque foreign bodies.   Assessment and Plan: Problem List Items Addressed This Visit   None   Visit Diagnoses    Tendinitis    -  Primary   Plantar fasciitis of left foot       Strain in the arch   Neuritis       Tarsal tunnel syndrome of  left side       Acquired equinus deformity of left foot           -Complete examination performed.  -Xrays reviewed with no acute osseous findings noted -Discussed with patient in detail the condition of mid arch plantar fasciitis with strain could possibly be recovering from a partial tear versus tendinitis versus resolving neuritis, how this occurs and general treatment options. Explained both conservative and surgical treatments.  -Advised patient to discontinue using Spenco arch support to see if her pain improves with discontinuing this device -Dispensed plantar fascial brace for patient  to use for adjustable support to control pronation and stress to the plantar fascia -Recommend good supportive shoes daily for foot type -Recommend gentle stretching and icing as instructed -May use over-the-counter pain relievers as needed -Patient to return to office in 1 month if doing better we will progress her to increase her activities however if doing worse we will proceed with getting a MRI to check for any soft tissue involvement since x-rays are negative.  Asencion Islam, DPM

## 2020-02-05 NOTE — Patient Instructions (Signed)

## 2020-03-04 ENCOUNTER — Ambulatory Visit: Payer: Commercial Managed Care - PPO | Admitting: Sports Medicine

## 2020-03-04 ENCOUNTER — Other Ambulatory Visit: Payer: Self-pay

## 2020-03-04 ENCOUNTER — Encounter: Payer: Self-pay | Admitting: Sports Medicine

## 2020-03-04 DIAGNOSIS — G5752 Tarsal tunnel syndrome, left lower limb: Secondary | ICD-10-CM

## 2020-03-04 DIAGNOSIS — M722 Plantar fascial fibromatosis: Secondary | ICD-10-CM

## 2020-03-04 DIAGNOSIS — M792 Neuralgia and neuritis, unspecified: Secondary | ICD-10-CM | POA: Diagnosis not present

## 2020-03-04 DIAGNOSIS — M779 Enthesopathy, unspecified: Secondary | ICD-10-CM

## 2020-03-04 DIAGNOSIS — M216X2 Other acquired deformities of left foot: Secondary | ICD-10-CM

## 2020-03-04 DIAGNOSIS — M79672 Pain in left foot: Secondary | ICD-10-CM | POA: Diagnosis not present

## 2020-03-04 NOTE — Progress Notes (Addendum)
Subjective: Rhonda Hurst is a 40 y.o. female patient follow-up evaluation of left foot and arch pain.  Patient reports that she is doing better and she forgot to wear her brace over the weekend and the medial side does not hurt feels a little achy and does not feel like it is pulling or tearing anymore patient denies any significant redness warmth swelling drainage or any other signs or symptoms of infection at this time to the left foot.   Patient Active Problem List   Diagnosis Date Noted  . Dyspnea 10/30/2018  . Anxiety 07/20/2016  . Thyroid nodule 07/20/2016    Current Outpatient Medications on File Prior to Visit  Medication Sig Dispense Refill  . amphetamine-dextroamphetamine (ADDERALL XR) 20 MG 24 hr capsule     . buPROPion (WELLBUTRIN XL) 150 MG 24 hr tablet Take 150 mg by mouth every morning.    Marland Kitchen buPROPion (ZYBAN) 150 MG 12 hr tablet     . busPIRone (BUSPAR) 15 MG tablet Take 15 mg by mouth 2 (two) times daily.    . busPIRone (BUSPAR) 5 MG tablet Take 1 tablet by mouth daily.    Marland Kitchen guaiFENesin-codeine 100-10 MG/5ML syrup Take by mouth.    . hydrOXYzine (ATARAX/VISTARIL) 25 MG tablet Take 1 tablet by mouth daily.    Marland Kitchen lisdexamfetamine (VYVANSE) 10 MG capsule Take 10 mg by mouth daily.    Marland Kitchen loratadine (CLARITIN) 10 MG tablet Take 10 mg by mouth daily.    . Melatonin 10 MG TABS Take 1 tablet by mouth daily.    . predniSONE (DELTASONE) 20 MG tablet Take 20 mg by mouth 2 (two) times daily.     No current facility-administered medications on file prior to visit.    Allergies  Allergen Reactions  . Sulfa Antibiotics   . Sulfamethoxazole Rash and Hives    Objective: Physical Exam General: The patient is alert and oriented x3 in no acute distress.  Dermatology: Skin is warm, dry and supple bilateral lower extremities. Nails 1-10 are normal. There is no erythema, edema, no eccymosis, no open lesions present. Integument is otherwise unremarkable.  Vascular: Dorsalis Pedis pulse  and Posterior Tibial pulse are 2/4 bilateral. Capillary fill time is immediate to all digits.  Neurological: Grossly intact to light touch bilateral.  Decreased sharp shooting pain to the medial foot over previous tibial nerve.  Musculoskeletal: There is significantly decreased tenderness palpation to the mid arch of the left foot no pain at the plantar fascial insertion, range of motion within normal limits, previous areas of pain have much improved.  There is mild reduced ankle joint range of motion consistent with equinus that is slowly improving in nature.  Assessment and Plan: Problem List Items Addressed This Visit   None   Visit Diagnoses    Arch pain, left    -  Primary   Tendinitis       Plantar fasciitis of left foot       Neuritis       Tarsal tunnel syndrome of left side       Acquired equinus deformity of left foot         -Complete examination performed.  -Discussed with patient condition of mid arch strain that is slowly improving/getting better on the left -Continue with plantar fascial brace for 2 more weeks, after 2 more weeks may slowly wean as directed. Patient may try to increase her physical activities, if there is pain with increasing activities, advised patient to not try to  push herself and if this happens may resume with brace until she can follow-up in office to prevent re-injury -Recommend good supportive shoes daily for foot type -Recommend gentle stretching and icing as instructed -May use over-the-counter pain relievers as needed like previous -Patient to return to office in 3 to 4 weeks or sooner problems or issues arise.  Asencion Islam, DPM

## 2020-03-25 ENCOUNTER — Encounter: Payer: Self-pay | Admitting: Sports Medicine

## 2020-03-25 ENCOUNTER — Ambulatory Visit: Payer: Commercial Managed Care - PPO | Admitting: Sports Medicine

## 2020-03-25 ENCOUNTER — Other Ambulatory Visit: Payer: Self-pay

## 2020-03-25 DIAGNOSIS — M79672 Pain in left foot: Secondary | ICD-10-CM

## 2020-03-25 DIAGNOSIS — M779 Enthesopathy, unspecified: Secondary | ICD-10-CM

## 2020-03-25 DIAGNOSIS — M722 Plantar fascial fibromatosis: Secondary | ICD-10-CM

## 2020-03-25 DIAGNOSIS — M792 Neuralgia and neuritis, unspecified: Secondary | ICD-10-CM

## 2020-03-25 DIAGNOSIS — G5752 Tarsal tunnel syndrome, left lower limb: Secondary | ICD-10-CM

## 2020-03-25 DIAGNOSIS — M216X2 Other acquired deformities of left foot: Secondary | ICD-10-CM

## 2020-03-25 NOTE — Progress Notes (Signed)
Subjective: Indi Willhite is a 40 y.o. female patient follow-up evaluation of left foot and arch pain.  Patient reports that she is doing better and but does have a little weird sensation/not really any pain but it feels like a little tickle to the bottom of the foot without the brace.  No other pedal complaints noted.  Patient Active Problem List   Diagnosis Date Noted  . Dyspnea 10/30/2018  . Anxiety 07/20/2016  . Thyroid nodule 07/20/2016    Current Outpatient Medications on File Prior to Visit  Medication Sig Dispense Refill  . amphetamine-dextroamphetamine (ADDERALL XR) 20 MG 24 hr capsule     . buPROPion (WELLBUTRIN XL) 150 MG 24 hr tablet Take 150 mg by mouth every morning.    Marland Kitchen buPROPion (ZYBAN) 150 MG 12 hr tablet     . busPIRone (BUSPAR) 15 MG tablet Take 15 mg by mouth 2 (two) times daily.    . busPIRone (BUSPAR) 5 MG tablet Take 1 tablet by mouth daily.    Marland Kitchen guaiFENesin-codeine 100-10 MG/5ML syrup Take by mouth.    . hydrOXYzine (ATARAX/VISTARIL) 25 MG tablet Take 1 tablet by mouth daily.    Marland Kitchen lisdexamfetamine (VYVANSE) 10 MG capsule Take 10 mg by mouth daily.    Marland Kitchen loratadine (CLARITIN) 10 MG tablet Take 10 mg by mouth daily.    . Melatonin 10 MG TABS Take 1 tablet by mouth daily.    . predniSONE (DELTASONE) 20 MG tablet Take 20 mg by mouth 2 (two) times daily.     No current facility-administered medications on file prior to visit.    Allergies  Allergen Reactions  . Sulfa Antibiotics   . Sulfamethoxazole Rash and Hives    Objective: Physical Exam General: The patient is alert and oriented x3 in no acute distress.  Dermatology: Skin is warm, dry and supple bilateral lower extremities. Nails 1-10 are normal. There is no erythema, edema, no eccymosis, no open lesions present. Integument is otherwise unremarkable.  Vascular: Dorsalis Pedis pulse and Posterior Tibial pulse are 2/4 bilateral. Capillary fill time is immediate to all digits.  Neurological: Grossly intact  to light touch bilateral.  Resolved sharp shooting pain to the medial foot over previous tibial nerve.  Musculoskeletal: There is very minimal pain along the midfoot insertion on the left, previous areas of pain have much improved.  There is mild reduced ankle joint range of motion consistent with equinus that is slowly improving in nature like before.  Assessment and Plan: Problem List Items Addressed This Visit   None   Visit Diagnoses    Arch pain, left    -  Primary   Plantar fasciitis of left foot       Tendinitis       Neuritis       Tarsal tunnel syndrome of left side       Acquired equinus deformity of left foot         -Complete examination performed.  -Re-Discussed with patient condition of mid arch strain that is slowly improving/getting better on the left -Applied arch pad to her left shoe and advised patient if this works well may benefit from over-the-counter versus custom insoles; information was given to patient to check insurance benefits -May slowly increase activities to tolerance avoid high impact to release and may resume running at 1 month if pain to be resolved -Recommend good supportive shoes daily for foot type -Recommend gentle stretching and icing as instructed -May use over-the-counter pain relievers as needed  like previous -Patient to return to office if pain fails to continue to improve or if she decides she wants custom orthotics or sooner problems or issues arise.  Asencion Islam, DPM

## 2020-03-25 NOTE — Patient Instructions (Signed)

## 2020-09-10 DIAGNOSIS — R252 Cramp and spasm: Secondary | ICD-10-CM | POA: Diagnosis not present

## 2020-09-10 DIAGNOSIS — F988 Other specified behavioral and emotional disorders with onset usually occurring in childhood and adolescence: Secondary | ICD-10-CM | POA: Diagnosis not present

## 2020-09-10 DIAGNOSIS — F419 Anxiety disorder, unspecified: Secondary | ICD-10-CM | POA: Diagnosis not present

## 2020-12-10 DIAGNOSIS — J329 Chronic sinusitis, unspecified: Secondary | ICD-10-CM | POA: Diagnosis not present

## 2020-12-10 DIAGNOSIS — F988 Other specified behavioral and emotional disorders with onset usually occurring in childhood and adolescence: Secondary | ICD-10-CM | POA: Diagnosis not present

## 2020-12-10 DIAGNOSIS — F419 Anxiety disorder, unspecified: Secondary | ICD-10-CM | POA: Diagnosis not present

## 2020-12-24 ENCOUNTER — Other Ambulatory Visit (HOSPITAL_COMMUNITY): Payer: Self-pay

## 2020-12-24 MED ORDER — AMPHETAMINE-DEXTROAMPHETAMINE 20 MG PO TABS
20.0000 mg | ORAL_TABLET | Freq: Two times a day (BID) | ORAL | 0 refills | Status: DC
  Filled 2020-12-24: qty 60, 30d supply, fill #0

## 2021-03-10 ENCOUNTER — Other Ambulatory Visit: Payer: Self-pay | Admitting: Interventional Radiology

## 2021-03-10 DIAGNOSIS — Z6826 Body mass index (BMI) 26.0-26.9, adult: Secondary | ICD-10-CM | POA: Diagnosis not present

## 2021-03-10 DIAGNOSIS — Z79899 Other long term (current) drug therapy: Secondary | ICD-10-CM | POA: Diagnosis not present

## 2021-03-10 DIAGNOSIS — F988 Other specified behavioral and emotional disorders with onset usually occurring in childhood and adolescence: Secondary | ICD-10-CM | POA: Diagnosis not present

## 2021-03-10 DIAGNOSIS — I872 Venous insufficiency (chronic) (peripheral): Secondary | ICD-10-CM

## 2021-03-10 DIAGNOSIS — F419 Anxiety disorder, unspecified: Secondary | ICD-10-CM | POA: Diagnosis not present

## 2021-03-10 DIAGNOSIS — I776 Arteritis, unspecified: Secondary | ICD-10-CM | POA: Diagnosis not present

## 2021-03-12 ENCOUNTER — Other Ambulatory Visit (HOSPITAL_COMMUNITY): Payer: Self-pay

## 2021-03-15 ENCOUNTER — Other Ambulatory Visit (HOSPITAL_COMMUNITY): Payer: Self-pay

## 2021-03-15 MED ORDER — ALPRAZOLAM 0.25 MG PO TABS
0.2500 mg | ORAL_TABLET | Freq: Two times a day (BID) | ORAL | 1 refills | Status: DC | PRN
  Filled 2021-03-15: qty 30, 15d supply, fill #0

## 2021-03-23 ENCOUNTER — Other Ambulatory Visit (HOSPITAL_COMMUNITY): Payer: Self-pay

## 2021-03-24 ENCOUNTER — Other Ambulatory Visit (HOSPITAL_COMMUNITY): Payer: Self-pay

## 2021-03-24 MED ORDER — AMPHETAMINE-DEXTROAMPHETAMINE 20 MG PO TABS
20.0000 mg | ORAL_TABLET | Freq: Two times a day (BID) | ORAL | 0 refills | Status: DC
  Filled 2021-03-24 – 2021-06-01 (×2): qty 60, 30d supply, fill #0

## 2021-04-08 ENCOUNTER — Ambulatory Visit
Admission: RE | Admit: 2021-04-08 | Discharge: 2021-04-08 | Disposition: A | Discharge status: Home / Self Care | Payer: 59 | Source: Ambulatory Visit | Attending: Interventional Radiology | Admitting: Interventional Radiology

## 2021-04-08 DIAGNOSIS — I872 Venous insufficiency (chronic) (peripheral): Secondary | ICD-10-CM

## 2021-04-08 DIAGNOSIS — I868 Varicose veins of other specified sites: Secondary | ICD-10-CM | POA: Diagnosis not present

## 2021-04-08 NOTE — Consult Note (Signed)
? ?Chief Complaint: ?Patient was seen in consultation today for evaluation of possible venous insufficiency ? ?Referring Physician(s): ?Rolm Gala, NP ? ?History of Present Illness: ?Rhonda Hurst is a 41 y.o. healthy female who presents for venous insufficiency evaluation.  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 She recently visited [address] and developed a rash on her right lower leg.  The rash had associated swelling, erythema, and pruritis.  She was given a steroid which helped and the rash eventually resolved.  She describes at least 2-3 other instances similar to this associated with walking long distances outside.  No known history of autoimmune disorders.  She endorses a family history of venous insufficiency.  Denies any ulceration, skin weeping, or history of deep vein thrombosis. ? ?Past Medical History:  ?Diagnosis Date  ? Anxiety   ? ? ?No past surgical history on file. ? ?Allergies: ?Sulfa antibiotics and Sulfamethoxazole ? ?Medications: ?Prior to Admission medications   ?Medication Sig Start Date End Date Taking? Authorizing Provider  ?ALPRAZolam (XANAX) 0.25 MG tablet Take 1 tablet (0.25 mg total) by mouth 2 (two) times daily as needed for anxiety. 03/15/21     ?amphetamine-dextroamphetamine (ADDERALL XR) 20 MG 24 hr capsule  10/11/19   [provider]  ?amphetamine-dextroamphetamine (ADDERALL) 20 MG tablet Take 1 tablet (20 mg total) by mouth 2 (two) times daily. 03/24/21     ?buPROPion (WELLBUTRIN XL) 150 MG 24 hr tablet Take 150 mg by mouth every morning. 01/01/20   [provider]  ?buPROPion (ZYBAN) 150 MG 12 hr tablet  10/11/19   [provider]  ?busPIRone (BUSPAR) 15 MG tablet Take 15 mg by mouth 2 (two) times daily. 12/24/19   [provider]  ?busPIRone (BUSPAR) 5 MG tablet Take 1 tablet by mouth daily. 10/23/18   [provider]  ?guaiFENesin-codeine 100-10 MG/5ML syrup Take by mouth. 08/21/19   [provider]  ?hydrOXYzine (ATARAX/VISTARIL) 25 MG tablet Take 1 tablet by mouth daily. 10/18/18   [provider]  ?lisdexamfetamine (VYVANSE) 10 MG capsule Take 10 mg by mouth daily.    [provider]  ?loratadine (CLARITIN) 10 MG tablet Take 10 mg by mouth daily.    [provider]  ?Melatonin 10 MG TABS Take 1 tablet by mouth daily.    [provider]  ?predniSONE (DELTASONE) 20 MG tablet Take 20 mg by mouth 2 (two) times daily. 08/21/19   [provider]  ?  ? ?Family History  ?Problem Relation Age of Onset  ? Hyperlipidemia Mother   ? Colon cancer Father   ? ? ?Social History  ? ?Socioeconomic History  ? Marital status: Married  ?  Spouse name: Not on file  ? Number of children: Not on file  ? Years of education: Not on file  ? Highest education level: Not on file  ?Occupational History  ? Not on file  ?Tobacco Use  ? Smoking status: Former  ? Smokeless tobacco: Never  ?Substance and Sexual Activity  ? Alcohol use: Not on file  ? Drug use: Not on file  ? Sexual activity: Not on file  ?Other Topics Concern  ? Not on file  ?Social History Narrative  ? Not on file  ? ?Social Determinants of Health  ? ?Financial Resource Strain: Not on file  ?Food Insecurity: Not on file  ?Transportation Needs: Not on file  ?Physical Activity: Not on file  ?Stress: Not on file  ?Social Connections: Not on file  ? ? ?  Review of Systems: A 12 point ROS discussed and pertinent positives are indicated in the HPI above.  All other systems are negative. ? ? ?Vital Signs: ?There were no vitals taken for this visit. ? ?Physical Exam ?Constitutional:   ?   General: She is not in acute distress. ?HENT:  ?   Head: Normocephalic.  ?   Mouth/Throat:  ?   Mouth: Mucous membranes are moist.  ?Cardiovascular:  ?   Rate and Rhythm: Normal rate and regular rhythm.  ?Pulmonary:  ?   Effort: Pulmonary effort is normal.  ?   Breath sounds: Normal breath sounds.  ?Abdominal:  ?   General: There is no  distension.  ?Musculoskeletal:     ?   General: No swelling or tenderness.  ?   Right lower leg: No edema.  ?   Left lower leg: No edema.  ?Skin: ?   General: Skin is warm and dry.  ?   Findings: No rash.  ?Neurological:  ?   Mental Status: She is alert and oriented to person, place, and time.  ?Psychiatric:     ?   Mood and Affect: Mood normal.     ?   Behavior: Behavior normal.  ? ? ?Imaging: ?[place] RLE Venous Insufficiency Duplex: ? ?No evidence of venous insufficiency or DVT. ? ?Labs: ?No recent pertinent labs. ? ?Assessment and Plan: ?41 year old female with history of recurrent lower extremity skin changes.  Venous duplex today eliminates venous insufficiency as the etiology.   ? ?Rheumatologic evaluation could be considered, as clinically indicated, as the differential for Trenise's recurrent symptoms remains broad. ? ? ? ?Electronically Signed: ?Bennie Dallas, MD ?04/08/2021, 11:36 AM ? ? ?I spent a total of  30 Minutes  in face to face in clinical consultation, greater than 50% of which was counseling/coordinating care for venous insufficiency evaluation. ? ?

## 2021-04-22 DIAGNOSIS — J9801 Acute bronchospasm: Secondary | ICD-10-CM | POA: Diagnosis not present

## 2021-04-22 DIAGNOSIS — Z20828 Contact with and (suspected) exposure to other viral communicable diseases: Secondary | ICD-10-CM | POA: Diagnosis not present

## 2021-04-22 DIAGNOSIS — J209 Acute bronchitis, unspecified: Secondary | ICD-10-CM | POA: Diagnosis not present

## 2021-04-22 DIAGNOSIS — M791 Myalgia, unspecified site: Secondary | ICD-10-CM | POA: Diagnosis not present

## 2021-04-22 DIAGNOSIS — R070 Pain in throat: Secondary | ICD-10-CM | POA: Diagnosis not present

## 2021-05-27 ENCOUNTER — Encounter: Payer: Self-pay | Admitting: Internal Medicine

## 2021-05-27 ENCOUNTER — Other Ambulatory Visit (HOSPITAL_COMMUNITY): Payer: Self-pay

## 2021-05-27 ENCOUNTER — Ambulatory Visit (INDEPENDENT_AMBULATORY_CARE_PROVIDER_SITE_OTHER): Payer: 59 | Admitting: Internal Medicine

## 2021-05-27 VITALS — BP 121/89 | HR 78 | Resp 12 | Ht 61.5 in | Wt 143.6 lb

## 2021-05-27 DIAGNOSIS — L959 Vasculitis limited to the skin, unspecified: Secondary | ICD-10-CM | POA: Diagnosis not present

## 2021-05-27 MED ORDER — TRIAMCINOLONE ACETONIDE 0.5 % EX OINT
1.0000 "application " | TOPICAL_OINTMENT | Freq: Two times a day (BID) | CUTANEOUS | 0 refills | Status: AC | PRN
  Filled 2021-05-27: qty 30, 15d supply, fill #0

## 2021-05-27 NOTE — Progress Notes (Signed)
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 female here for evaluation of suspected vasculitis lower extremity rashes. Symptoms first started after a long hiking trip walking 13 miles in a day and resolved spontaneously after a few days. She also developed rashes after [place] trip first in the summer and more recently earlier this year in February. She is an active runner and participate in half marathons and recently developed rash after a 40 minute run which is new. She has started to experience some swelling and mild redness also at her work. Symptoms are improved in areas wearing compression socks or under leggings. Lower extremity venous duplex ultrasound was negative for DVT or suggestive changes for venous insufficiency. Her mother reportedly had similar symptoms but provoked with mild provocation such as walking to mow the lawn and involving proximal leg as well. She has mild erythema and flushing in her face and upper chest worse with pressure and this predates leg rashes. She has a recurrent ulceration on right side of her noes in the past year.  Activities of Daily Living:  Patient reports morning stiffness for 0  none .   Patient Reports nocturnal pain.  Difficulty dressing/grooming: Denies Difficulty climbing stairs: Denies Difficulty getting out of chair: Denies Difficulty using hands for taps, buttons, cutlery, and/or writing: Denies  Review of Systems  Constitutional:  Negative for fatigue.  HENT:  Negative for mouth dryness.   Eyes:  Negative for dryness.  Respiratory:  Negative for shortness of breath.   Cardiovascular:  Positive for swelling in legs/feet.  Gastrointestinal:  Negative for diarrhea.   Endocrine: Negative for excessive thirst.  Genitourinary:  Negative for difficulty urinating.  Musculoskeletal:  Positive for joint pain, joint pain and joint swelling.  Skin:  Positive for rash.  Allergic/Immunologic: Negative for susceptible to infections.  Neurological:  Negative for weakness.  Hematological:  Negative for bruising/bleeding tendency.  Psychiatric/Behavioral:  Negative for sleep disturbance.    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   Dyspnea 10/30/2018   Anxiety 07/20/2016   Thyroid nodule 07/20/2016    Past Medical History:  Diagnosis Date   ADHD    Anxiety     Family History  Problem Relation Age of Onset   Hyperlipidemia Mother    Skin cancer Mother    Colon cancer Father    Non-Hodgkin's lymphoma Father    Past Surgical History:  Procedure Laterality Date   ABLATION     CHOLECYSTECTOMY     NASAL SINUS SURGERY     Recontruction   TONSILLECTOMY     Social History   Social History Narrative   Not on file   Immunization History  Administered Date(s) Administered   PFIZER(Purple Top)SARS-COV-2 Vaccination 01/22/2019, 03/02/2019     Objective: Vital Signs: BP 121/89 (BP Location: Right Arm, Patient Position: Sitting, Cuff Size: Small)   Pulse 78   Resp 12   Ht 5' 1.5" (1.562 m)   Wt 143 lb 9.6 oz (65.1 kg)   BMI 26.69 kg/m    Physical  Exam HENT:     Nose: Nose normal.     Mouth/Throat:     Mouth: Mucous membranes are moist.     Pharynx: Oropharynx is clear.  Eyes:     Conjunctiva/sclera: Conjunctivae normal.  Cardiovascular:     Rate and Rhythm: Normal rate and regular rhythm.  Pulmonary:     Effort: Pulmonary effort is normal.     Breath sounds: Normal breath sounds.  Lymphadenopathy:     Cervical: No cervical adenopathy.  Skin:    General: Skin is warm and dry.     Comments: Very mild erythema on medial sides of distal legs, no current pitting edema no tortuous  superficial veins  Neurological:     Mental Status: She is alert.     Deep Tendon Reflexes: Reflexes normal.  Psychiatric:        Mood and Affect: Mood normal.     Musculoskeletal Exam:  Shoulders full ROM no tenderness or swelling Elbows full ROM no tenderness or swelling Wrists full ROM no tenderness or swelling Fingers full ROM no tenderness or swelling Knees full ROM no tenderness or swelling Ankles full ROM no tenderness or swelling   Investigation: No additional findings.  Imaging: No results found.  Recent Labs: Lab Results  Component Value Date   WBC 6.9 05/27/2021   HGB 15.4 05/27/2021   PLT 319 05/27/2021    Speciality Comments: No specialty comments available.  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  There is some familial association in this process but no specific genetic or protein marker.  We will check serology today for evidence of underlying systemic process including sed rate ANCA ANA complements and CBC.  I do not know that referral to pursue biopsy will be high yield with intermittent nature of rashes and suspected diagnosis does not have a definitive pathologic criteria. She is already using compression and antihistamines.  Discussed use of as needed NSAIDs could be beneficial.  Also prescribed triamcinolone 0.5% topical to use on affected areas for now.  Orders: Orders Placed This Encounter  Procedures   Sedimentation rate   ANCA Screen Reflex Titer(QUEST)   ANA   C3 and C4   CBC with Differential/Platelet   Meds ordered this encounter  Medications   triamcinolone ointment (KENALOG) 0.5 %    Sig: Apply 1  application topically 2 (two) times daily as needed.    Dispense:  30 g    Refill:  0     Follow-Up Instructions: Return in about 3 weeks (around 06/17/2021) for New pt EIV f/u 3wks.   Fuller Plan, MD  Note - This record has been created using [employer].  Chart creation errors have been sought, but may not always  have been located. Such creation errors do not reflect on  the standard of medical care.

## 2021-06-01 ENCOUNTER — Other Ambulatory Visit (HOSPITAL_COMMUNITY): Payer: Self-pay

## 2021-06-05 LAB — ANA: Anti Nuclear Antibody (ANA): POSITIVE — AB

## 2021-06-05 LAB — CBC WITH DIFFERENTIAL/PLATELET
Absolute Monocytes: 683 cells/uL (ref 200–950)
Basophils Absolute: 69 cells/uL (ref 0–200)
Basophils Relative: 1 %
Eosinophils Absolute: 248 cells/uL (ref 15–500)
Eosinophils Relative: 3.6 %
HCT: 44.7 % (ref 35.0–45.0)
Hemoglobin: 15.4 g/dL (ref 11.7–15.5)
Lymphs Abs: 1939 cells/uL (ref 850–3900)
MCH: 31.4 pg (ref 27.0–33.0)
MCHC: 34.5 g/dL (ref 32.0–36.0)
MCV: 91 fL (ref 80.0–100.0)
MPV: 11.2 fL (ref 7.5–12.5)
Monocytes Relative: 9.9 %
Neutro Abs: 3961 cells/uL (ref 1500–7800)
Neutrophils Relative %: 57.4 %
Platelets: 319 10*3/uL (ref 140–400)
RBC: 4.91 10*6/uL (ref 3.80–5.10)
RDW: 12.2 % (ref 11.0–15.0)
Total Lymphocyte: 28.1 %
WBC: 6.9 10*3/uL (ref 3.8–10.8)

## 2021-06-05 LAB — ANTI-NUCLEAR AB-TITER (ANA TITER): ANA Titer 1: 1:80 {titer} — ABNORMAL HIGH

## 2021-06-05 LAB — SEDIMENTATION RATE: Sed Rate: 9 mm/h (ref 0–20)

## 2021-06-05 LAB — C3 AND C4
C3 Complement: 139 mg/dL (ref 83–193)
C4 Complement: 32 mg/dL (ref 15–57)

## 2021-06-05 LAB — ANCA SCREEN W REFLEX TITER: ANCA SCREEN: NEGATIVE

## 2021-06-08 NOTE — Progress Notes (Signed)
Office Visit Note  Patient: Rhonda Hurst             Date of Birth: 02-Jul-1980           MRN: [id number]             PCP: Haydee Salter, NP Referring: Haydee Salter, NP Visit Date: 06/17/2021   Subjective:  Follow-up (Doing good)   History of Present Illness: Rhonda Hurst is a 41 y.o. female here for follow up for suspected vasculitis lower extremity rashes. Since our last visit she is not having new episodes of inflammation so has not tried using triamcinolone for this. She is seeing some swelling around the ankles. Wearing socks continues effectiveness at improving overlying skin changes.  Lab test at our initial visit showed a positive ANA wise negative inflammatory markers and negative ANCA serology..  Previous HPI 05/27/2021 Rhonda Hurst is a 41 y.o. female here for evaluation of suspected vasculitis lower extremity rashes. Symptoms first started after a long hiking trip walking 13 miles in a day and resolved spontaneously after a few days. She also developed rashes after disney world trip first in the summer and more recently earlier this year in February. She is an active runner and participate in half marathons and recently developed rash after a 40 minute run which is new. She has started to experience some swelling and mild redness also at her work. Symptoms are improved in areas wearing compression socks or under leggings. Lower extremity venous duplex ultrasound was negative for DVT or suggestive changes for venous insufficiency. Her mother reportedly had similar symptoms but provoked with mild provocation such as walking to mow the lawn and involving proximal leg as well. She has mild erythema and flushing in her face and upper chest worse with pressure and this predates leg rashes. She has a recurrent ulceration on right side of her nose in the past year.   Review of Systems  Constitutional:  Positive for fatigue.  HENT:  Negative for mouth dryness.   Eyes:  Negative for dryness.   Respiratory:  Negative for shortness of breath.   Cardiovascular:  Positive for swelling in legs/feet.  Gastrointestinal:  Negative for constipation.  Endocrine: Negative for excessive thirst.  Genitourinary:  Negative for difficulty urinating.  Musculoskeletal:  Negative for morning stiffness.  Skin:  Negative for rash.  Allergic/Immunologic: Negative for susceptible to infections.  Neurological:  Negative for numbness.  Hematological:  Negative for bruising/bleeding tendency.  Psychiatric/Behavioral:  Negative for sleep disturbance.     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   Dyspnea 10/30/2018   Anxiety 07/20/2016   Thyroid nodule 07/20/2016    Past Medical History:  Diagnosis Date   ADHD    Anxiety     Family History  Problem Relation Age of Onset   Hyperlipidemia Mother    Skin cancer Mother    Colon cancer Father    Non-Hodgkin's lymphoma Father    Past Surgical History:  Procedure Laterality Date   ABLATION     CHOLECYSTECTOMY     NASAL SINUS SURGERY     Recontruction   TONSILLECTOMY     Social History   Social History Narrative   Not on file   Immunization History  Administered Date(s) Administered   PFIZER(Purple Top)SARS-COV-2 Vaccination 01/22/2019, 03/02/2019     Objective: Vital Signs: BP 112/71 (BP Location: Left Arm, Patient Position: Sitting,  Cuff Size: Normal)   Pulse 84   Resp 14   Ht 5\' 1"  (1.549 m)   Wt 143 lb (64.9 kg)   BMI 27.02 kg/m    Physical Exam Cardiovascular:     Rate and Rhythm: Normal rate and regular rhythm.  Pulmonary:     Effort: Pulmonary effort is normal.     Breath sounds: Normal breath sounds.  Musculoskeletal:     Right lower leg: No edema.     Left lower leg: No edema.  Skin:    General: Skin is warm and dry.     Findings: No rash.  Neurological:     Mental Status: She is alert.   Psychiatric:        Mood and Affect: Mood normal.      Musculoskeletal Exam:  Neck full ROM no tenderness Shoulders full ROM no tenderness or swelling Elbows full ROM no tenderness or swelling Wrists full ROM no tenderness or swelling Fingers full ROM no tenderness or swelling Knees full ROM no tenderness or swelling Ankles full ROM no tenderness or swelling  Investigation: No additional findings.  Imaging: No results found.  Recent Labs: Lab Results  Component Value Date   WBC 6.9 05/27/2021   HGB 15.4 05/27/2021   PLT 319 05/27/2021    Speciality Comments: No specialty comments available.  Procedures:  No procedures performed Allergies: Sulfa antibiotics and Sulfamethoxazole   Assessment / Plan:     Visit Diagnoses: Cutaneous vasculitis - triamcinolone 0.5% topical to use on affected areas for now.  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  Currently doing well.  Recommend continuing supportive type of treatments such as socks or other compression on the affected areas and elevating feet.  Topical triamcinolone will be recommended for use as needed if symptoms flare back up we could take another look if there is any progression.  Positive ANA (antinuclear antibody) - Plan: RNP Antibody, Anti-Smith antibody, Sjogrens syndrome-A extractable nuclear antibody, Anti-DNA antibody, double-stranded, C3 and C4  Positive ANA checked associated with concern of skin vasculitis but does not have other systemic clinical criteria.  We will check specific antibodies as detailed above and serum complements.  Low pretest suspicion that this represents real systemic connective tissue disease.  Orders: Orders Placed This Encounter  Procedures   RNP Antibody   Anti-Smith antibody   Sjogrens syndrome-A extractable nuclear antibody   Anti-DNA antibody, double-stranded   C3 and C4   No orders of the defined types were  placed in this encounter.    Follow-Up Instructions: Return if symptoms worsen or fail to improve.   Collier Salina, MD  Note - This record has been created using [employer].  Chart creation errors have been sought, but may not always  have been located. Such creation errors do not reflect on  the standard of medical care.

## 2021-06-10 DIAGNOSIS — Z0000 Encounter for general adult medical examination without abnormal findings: Secondary | ICD-10-CM | POA: Diagnosis not present

## 2021-06-10 DIAGNOSIS — Z6826 Body mass index (BMI) 26.0-26.9, adult: Secondary | ICD-10-CM | POA: Diagnosis not present

## 2021-06-10 DIAGNOSIS — Z1331 Encounter for screening for depression: Secondary | ICD-10-CM | POA: Diagnosis not present

## 2021-06-10 DIAGNOSIS — N951 Menopausal and female climacteric states: Secondary | ICD-10-CM | POA: Diagnosis not present

## 2021-06-17 ENCOUNTER — Ambulatory Visit (INDEPENDENT_AMBULATORY_CARE_PROVIDER_SITE_OTHER): Payer: 59 | Admitting: Internal Medicine

## 2021-06-17 ENCOUNTER — Encounter: Payer: Self-pay | Admitting: Internal Medicine

## 2021-06-17 VITALS — BP 112/71 | HR 84 | Resp 14 | Ht 61.0 in | Wt 143.0 lb

## 2021-06-17 DIAGNOSIS — L959 Vasculitis limited to the skin, unspecified: Secondary | ICD-10-CM | POA: Diagnosis not present

## 2021-06-17 DIAGNOSIS — R768 Other specified abnormal immunological findings in serum: Secondary | ICD-10-CM | POA: Diagnosis not present

## 2021-06-18 LAB — ANTI-SMITH ANTIBODY: ENA SM Ab Ser-aCnc: <1 AI

## 2021-06-18 LAB — RNP ANTIBODY: Ribonucleic Protein(ENA) Antibody, IgG: <1 AI

## 2021-06-18 LAB — C3 AND C4
C3 Complement: 132 mg/dL (ref 83–193)
C4 Complement: 30 mg/dL (ref 15–57)

## 2021-06-18 LAB — ANTI-DNA ANTIBODY, DOUBLE-STRANDED: ds DNA Ab: <1 IU/mL

## 2021-06-18 LAB — SJOGRENS SYNDROME-A EXTRACTABLE NUCLEAR ANTIBODY: SSA (Ro) (ENA) Antibody, IgG: <1 AI

## 2021-06-20 NOTE — Progress Notes (Signed)
Lab results are all negative for more specific antibody markers. I don't think we need to do any more testing or start medications at this time, can follow up as needed if symptoms worsen or develops new issues.

## 2021-07-28 ENCOUNTER — Other Ambulatory Visit (HOSPITAL_COMMUNITY): Payer: Self-pay

## 2021-07-28 MED ORDER — ALPRAZOLAM 0.25 MG PO TABS
0.2500 mg | ORAL_TABLET | Freq: Two times a day (BID) | ORAL | 3 refills | Status: DC | PRN
  Filled 2021-07-28: qty 30, 15d supply, fill #0

## 2021-08-03 ENCOUNTER — Other Ambulatory Visit (HOSPITAL_COMMUNITY): Payer: Self-pay

## 2021-08-20 ENCOUNTER — Ambulatory Visit: Payer: 59 | Admitting: Internal Medicine

## 2021-09-01 DIAGNOSIS — R509 Fever, unspecified: Secondary | ICD-10-CM | POA: Diagnosis not present

## 2021-09-01 DIAGNOSIS — M791 Myalgia, unspecified site: Secondary | ICD-10-CM | POA: Diagnosis not present

## 2021-09-28 ENCOUNTER — Other Ambulatory Visit (HOSPITAL_COMMUNITY): Payer: Self-pay

## 2021-09-28 MED ORDER — HYDROXYZINE HCL 25 MG PO TABS
25.0000 mg | ORAL_TABLET | Freq: Three times a day (TID) | ORAL | 6 refills | Status: DC | PRN
  Filled 2021-09-28 – 2021-10-13 (×2): qty 90, 30d supply, fill #0
  Filled 2022-02-17: qty 90, 30d supply, fill #1
  Filled 2022-05-16: qty 90, 30d supply, fill #2

## 2021-10-12 ENCOUNTER — Other Ambulatory Visit (HOSPITAL_COMMUNITY): Payer: Self-pay

## 2021-10-13 ENCOUNTER — Other Ambulatory Visit (HOSPITAL_COMMUNITY): Payer: Self-pay

## 2021-11-02 ENCOUNTER — Other Ambulatory Visit (HOSPITAL_COMMUNITY): Payer: Self-pay

## 2021-11-02 MED ORDER — ALPRAZOLAM 0.25 MG PO TABS
0.2500 mg | ORAL_TABLET | Freq: Two times a day (BID) | ORAL | 3 refills | Status: DC | PRN
  Filled 2021-11-02: qty 30, 15d supply, fill #0

## 2021-12-21 ENCOUNTER — Other Ambulatory Visit (HOSPITAL_COMMUNITY): Payer: Self-pay

## 2021-12-21 DIAGNOSIS — F988 Other specified behavioral and emotional disorders with onset usually occurring in childhood and adolescence: Secondary | ICD-10-CM | POA: Diagnosis not present

## 2021-12-21 DIAGNOSIS — Z6827 Body mass index (BMI) 27.0-27.9, adult: Secondary | ICD-10-CM | POA: Diagnosis not present

## 2021-12-21 DIAGNOSIS — F419 Anxiety disorder, unspecified: Secondary | ICD-10-CM | POA: Diagnosis not present

## 2021-12-21 MED ORDER — ALPRAZOLAM 0.25 MG PO TABS
0.2500 mg | ORAL_TABLET | Freq: Two times a day (BID) | ORAL | 3 refills | Status: DC | PRN
  Filled 2021-12-21: qty 30, 15d supply, fill #0
  Filled 2022-02-17: qty 30, 15d supply, fill #1

## 2021-12-21 MED ORDER — METHYLPHENIDATE HCL 10 MG PO TABS
10.0000 mg | ORAL_TABLET | Freq: Two times a day (BID) | ORAL | 0 refills | Status: DC
  Filled 2021-12-21: qty 60, 30d supply, fill #0

## 2022-02-18 ENCOUNTER — Other Ambulatory Visit: Payer: Self-pay

## 2022-02-21 DIAGNOSIS — R5383 Other fatigue: Secondary | ICD-10-CM | POA: Diagnosis not present

## 2022-03-24 DIAGNOSIS — G5603 Carpal tunnel syndrome, bilateral upper limbs: Secondary | ICD-10-CM | POA: Diagnosis not present

## 2022-04-11 ENCOUNTER — Other Ambulatory Visit (HOSPITAL_COMMUNITY): Payer: Self-pay

## 2022-04-11 MED ORDER — ALPRAZOLAM 0.25 MG PO TABS
0.2500 mg | ORAL_TABLET | Freq: Two times a day (BID) | ORAL | 3 refills | Status: DC
  Filled 2022-04-11: qty 30, 15d supply, fill #0
  Filled 2022-05-16: qty 30, 15d supply, fill #1
  Filled 2022-07-16: qty 30, 15d supply, fill #2
  Filled 2022-09-20: qty 30, 15d supply, fill #3

## 2022-05-16 ENCOUNTER — Other Ambulatory Visit (HOSPITAL_COMMUNITY): Payer: Self-pay

## 2022-05-17 ENCOUNTER — Other Ambulatory Visit (HOSPITAL_COMMUNITY): Payer: Self-pay

## 2022-05-17 ENCOUNTER — Other Ambulatory Visit: Payer: Self-pay

## 2022-05-17 MED ORDER — METHYLPHENIDATE HCL 10 MG PO TABS
10.0000 mg | ORAL_TABLET | Freq: Two times a day (BID) | ORAL | 0 refills | Status: DC
  Filled 2022-05-17: qty 60, 30d supply, fill #0

## 2022-06-30 DIAGNOSIS — G5601 Carpal tunnel syndrome, right upper limb: Secondary | ICD-10-CM | POA: Diagnosis not present

## 2022-06-30 DIAGNOSIS — M79641 Pain in right hand: Secondary | ICD-10-CM | POA: Diagnosis not present

## 2022-06-30 DIAGNOSIS — M79642 Pain in left hand: Secondary | ICD-10-CM | POA: Diagnosis not present

## 2022-07-11 DIAGNOSIS — R232 Flushing: Secondary | ICD-10-CM | POA: Diagnosis not present

## 2022-07-11 DIAGNOSIS — R635 Abnormal weight gain: Secondary | ICD-10-CM | POA: Diagnosis not present

## 2022-07-11 DIAGNOSIS — E049 Nontoxic goiter, unspecified: Secondary | ICD-10-CM | POA: Diagnosis not present

## 2022-07-11 DIAGNOSIS — E785 Hyperlipidemia, unspecified: Secondary | ICD-10-CM | POA: Diagnosis not present

## 2022-07-11 DIAGNOSIS — F419 Anxiety disorder, unspecified: Secondary | ICD-10-CM | POA: Diagnosis not present

## 2022-07-11 DIAGNOSIS — Z6829 Body mass index (BMI) 29.0-29.9, adult: Secondary | ICD-10-CM | POA: Diagnosis not present

## 2022-07-11 DIAGNOSIS — Z800 Family history of malignant neoplasm of digestive organs: Secondary | ICD-10-CM | POA: Diagnosis not present

## 2022-07-11 DIAGNOSIS — R5383 Other fatigue: Secondary | ICD-10-CM | POA: Diagnosis not present

## 2022-07-11 DIAGNOSIS — F988 Other specified behavioral and emotional disorders with onset usually occurring in childhood and adolescence: Secondary | ICD-10-CM | POA: Diagnosis not present

## 2022-07-12 ENCOUNTER — Other Ambulatory Visit (HOSPITAL_COMMUNITY)
Admission: AD | Admit: 2022-07-12 | Discharge: 2022-07-12 | Disposition: A | Discharge status: Home / Self Care | Payer: 59 | Source: Ambulatory Visit | Attending: Internal Medicine | Admitting: Internal Medicine

## 2022-07-12 DIAGNOSIS — R232 Flushing: Secondary | ICD-10-CM | POA: Diagnosis not present

## 2022-07-12 LAB — CORTISOL: Cortisol, Plasma: 7.7 ug/dL

## 2022-07-12 LAB — HEMOGLOBIN A1C
Hgb A1c MFr Bld: 4.7 % — ABNORMAL LOW (ref 4.8–5.6)
Mean Plasma Glucose: 88.19 mg/dL

## 2022-07-12 LAB — LIPID PANEL
Cholesterol: 265 mg/dL — ABNORMAL HIGH (ref 0–200)
HDL: 82 mg/dL (ref >40)
LDL Cholesterol: 168 mg/dL — ABNORMAL HIGH (ref 0–99)
Total CHOL/HDL Ratio: 3.2 RATIO
Triglycerides: 74 mg/dL (ref <150)
VLDL: 15 mg/dL (ref 0–40)

## 2022-07-12 LAB — TSH: TSH: 2.459 u[IU]/mL (ref 0.350–4.500)

## 2022-07-13 LAB — PROGESTERONE: Progesterone: <0.1 ng/mL

## 2022-07-13 LAB — FSH/LH
FSH: 88 m[IU]/mL
LH: 37.3 m[IU]/mL

## 2022-07-13 LAB — MISC LABCORP TEST (SEND OUT): Labcorp test code: 1974

## 2022-07-13 LAB — T3: T3, Total: 162 ng/dL (ref 71–180)

## 2022-07-13 LAB — TESTOSTERONE: Testosterone: 5 ng/dL — ABNORMAL LOW (ref 8–60)

## 2022-07-13 LAB — ESTRADIOL: Estradiol: <5 pg/mL

## 2022-07-13 LAB — DHEA-SULFATE: DHEA-SO4: 75.7 ug/dL (ref 57.3–279.2)

## 2022-07-13 LAB — INSULIN, RANDOM: Insulin: 13.7 u[IU]/mL (ref 2.6–24.9)

## 2022-07-18 ENCOUNTER — Other Ambulatory Visit (HOSPITAL_COMMUNITY): Payer: Self-pay

## 2022-07-18 ENCOUNTER — Other Ambulatory Visit: Payer: Self-pay

## 2022-07-20 ENCOUNTER — Other Ambulatory Visit (HOSPITAL_COMMUNITY): Payer: Self-pay

## 2022-07-20 ENCOUNTER — Other Ambulatory Visit: Payer: Self-pay | Admitting: Oncology

## 2022-07-20 DIAGNOSIS — Z006 Encounter for examination for normal comparison and control in clinical research program: Secondary | ICD-10-CM

## 2022-07-20 MED ORDER — METHYLPHENIDATE HCL 10 MG PO TABS
10.0000 mg | ORAL_TABLET | Freq: Two times a day (BID) | ORAL | 0 refills | Status: AC
  Filled 2022-07-20: qty 60, 30d supply, fill #0

## 2022-09-02 ENCOUNTER — Other Ambulatory Visit (HOSPITAL_COMMUNITY): Payer: Self-pay

## 2022-09-02 DIAGNOSIS — Z01419 Encounter for gynecological examination (general) (routine) without abnormal findings: Secondary | ICD-10-CM | POA: Diagnosis not present

## 2022-09-02 DIAGNOSIS — Z1231 Encounter for screening mammogram for malignant neoplasm of breast: Secondary | ICD-10-CM | POA: Diagnosis not present

## 2022-09-02 MED ORDER — MEDROXYPROGESTERONE ACETATE 2.5 MG PO TABS
2.5000 mg | ORAL_TABLET | Freq: Every day | ORAL | 3 refills | Status: DC
  Filled 2022-09-02: qty 90, 90d supply, fill #0
  Filled 2022-11-21: qty 90, 90d supply, fill #1
  Filled 2023-02-28 – 2023-03-13 (×2): qty 90, 90d supply, fill #2
  Filled 2023-06-06: qty 90, 90d supply, fill #3

## 2022-09-02 MED ORDER — ESTRADIOL 1 MG PO TABS
1.0000 mg | ORAL_TABLET | Freq: Every day | ORAL | 3 refills | Status: DC
  Filled 2022-09-02: qty 90, 90d supply, fill #0
  Filled 2022-11-21: qty 90, 90d supply, fill #1
  Filled 2023-02-28 – 2023-03-13 (×2): qty 90, 90d supply, fill #2
  Filled 2023-06-06: qty 90, 90d supply, fill #3

## 2022-09-13 DIAGNOSIS — Z1211 Encounter for screening for malignant neoplasm of colon: Secondary | ICD-10-CM | POA: Diagnosis not present

## 2022-09-13 DIAGNOSIS — Z800 Family history of malignant neoplasm of digestive organs: Secondary | ICD-10-CM | POA: Diagnosis not present

## 2022-09-13 DIAGNOSIS — K219 Gastro-esophageal reflux disease without esophagitis: Secondary | ICD-10-CM | POA: Diagnosis not present

## 2022-09-20 ENCOUNTER — Other Ambulatory Visit: Payer: Self-pay

## 2022-10-18 ENCOUNTER — Other Ambulatory Visit (HOSPITAL_COMMUNITY): Payer: Self-pay

## 2022-10-18 MED ORDER — PEG 3350-KCL-NA BICARB-NACL 420 G PO SOLR
ORAL | 0 refills | Status: AC
  Filled 2022-10-18: qty 4000, 1d supply, fill #0

## 2022-10-18 MED ORDER — BISACODYL 5 MG PO TBEC
DELAYED_RELEASE_TABLET | ORAL | 0 refills | Status: AC
  Filled 2022-10-18: qty 4, 1d supply, fill #0

## 2022-10-19 ENCOUNTER — Other Ambulatory Visit (HOSPITAL_COMMUNITY): Payer: Self-pay

## 2022-10-26 DIAGNOSIS — K649 Unspecified hemorrhoids: Secondary | ICD-10-CM | POA: Diagnosis not present

## 2022-10-26 DIAGNOSIS — Z1211 Encounter for screening for malignant neoplasm of colon: Secondary | ICD-10-CM | POA: Diagnosis not present

## 2022-10-26 DIAGNOSIS — K5140 Inflammatory polyps of colon without complications: Secondary | ICD-10-CM | POA: Diagnosis not present

## 2022-10-26 DIAGNOSIS — K644 Residual hemorrhoidal skin tags: Secondary | ICD-10-CM | POA: Diagnosis not present

## 2022-10-26 DIAGNOSIS — Z800 Family history of malignant neoplasm of digestive organs: Secondary | ICD-10-CM | POA: Diagnosis not present

## 2022-10-26 DIAGNOSIS — D123 Benign neoplasm of transverse colon: Secondary | ICD-10-CM | POA: Diagnosis not present

## 2022-11-03 ENCOUNTER — Other Ambulatory Visit: Payer: 59

## 2022-11-07 ENCOUNTER — Other Ambulatory Visit (HOSPITAL_COMMUNITY): Payer: Self-pay

## 2022-11-07 MED ORDER — ALPRAZOLAM 0.25 MG PO TABS
0.2500 mg | ORAL_TABLET | Freq: Two times a day (BID) | ORAL | 3 refills | Status: DC | PRN
  Filled 2022-11-07: qty 30, 15d supply, fill #0
  Filled 2022-12-27: qty 30, 15d supply, fill #1
  Filled 2023-01-25: qty 30, 15d supply, fill #2
  Filled 2023-03-09: qty 30, 15d supply, fill #3

## 2022-11-08 ENCOUNTER — Other Ambulatory Visit: Payer: Self-pay

## 2022-11-14 ENCOUNTER — Telehealth: Payer: 59 | Admitting: Physician Assistant

## 2022-11-14 ENCOUNTER — Other Ambulatory Visit (HOSPITAL_COMMUNITY): Payer: Self-pay

## 2022-11-14 DIAGNOSIS — J069 Acute upper respiratory infection, unspecified: Secondary | ICD-10-CM

## 2022-11-14 DIAGNOSIS — J4521 Mild intermittent asthma with (acute) exacerbation: Secondary | ICD-10-CM

## 2022-11-14 MED ORDER — PROMETHAZINE-DM 6.25-15 MG/5ML PO SYRP
5.0000 mL | ORAL_SOLUTION | Freq: Four times a day (QID) | ORAL | 0 refills | Status: DC | PRN
Start: 2022-11-14 — End: 2023-02-07
  Filled 2022-11-14: qty 118, 6d supply, fill #0

## 2022-11-14 MED ORDER — BENZONATATE 100 MG PO CAPS
100.0000 mg | ORAL_CAPSULE | Freq: Three times a day (TID) | ORAL | 0 refills | Status: DC | PRN
Start: 2022-11-14 — End: 2023-02-07
  Filled 2022-11-14: qty 30, 5d supply, fill #0

## 2022-11-14 MED ORDER — PREDNISONE 20 MG PO TABS
40.0000 mg | ORAL_TABLET | Freq: Every day | ORAL | 0 refills | Status: DC
Start: 2022-11-14 — End: 2023-02-08
  Filled 2022-11-14: qty 10, 5d supply, fill #0

## 2022-11-14 MED ORDER — ONDANSETRON 4 MG PO TBDP
4.0000 mg | ORAL_TABLET | Freq: Three times a day (TID) | ORAL | 0 refills | Status: AC | PRN
Start: 2022-11-14 — End: ?
  Filled 2022-11-14: qty 20, 7d supply, fill #0

## 2022-11-14 MED ORDER — ALBUTEROL SULFATE HFA 108 (90 BASE) MCG/ACT IN AERS
1.0000 | INHALATION_SPRAY | Freq: Four times a day (QID) | RESPIRATORY_TRACT | 0 refills | Status: DC | PRN
Start: 2022-11-14 — End: 2023-02-07
  Filled 2022-11-14: qty 6.7, 25d supply, fill #0

## 2022-11-14 NOTE — Progress Notes (Signed)
Virtual Visit Consent   Rhonda Hurst, you are scheduled for a virtual visit with a  provider today. Just as with appointments in the office, your consent must be obtained to participate. Your consent will be active for this visit and any virtual visit you may have with one of our providers in the next 365 days. If you have a MyChart account, a copy of this consent can be sent to you electronically.  As this is a virtual visit, video technology does not allow for your provider to perform a traditional examination. This may limit your provider's ability to fully assess your condition. If your provider identifies any concerns that need to be evaluated in person or the need to arrange testing (such as labs, EKG, etc.), we will make arrangements to do so. Although advances in technology are sophisticated, we cannot ensure that it will always work on either your end or our end. If the connection with a video visit is poor, the visit may have to be switched to a telephone visit. With either a video or telephone visit, we are not always able to ensure that we have a secure connection.  By engaging in this virtual visit, you consent to the provision of healthcare and authorize for your insurance to be billed (if applicable) for the services provided during this visit. Depending on your insurance coverage, you may receive a charge related to this service.  I need to obtain your verbal consent now. Are you willing to proceed with your visit today? [name] has provided verbal consent on 11/14/2022 for a virtual visit (video or telephone). [name], PA-C  Date: 11/14/2022 4:23 PM  Virtual Visit via Video Note   I, [name], connected with  Rhonda Hurst  ([id number], [birth year]) on 11/14/22 at  4:15 PM EST by a video-enabled telemedicine application and verified that I am speaking with the correct person using two identifiers.  Location: Patient: Virtual Visit Location Patient:  Mobile Provider: Virtual Visit Location Provider: Home Office   I discussed the limitations of evaluation and management by telemedicine and the availability of in person appointments. The patient expressed understanding and agreed to proceed.    History of Present Illness: Rhonda Hurst is a 42 y.o. who identifies as a female who was assigned female at birth, and is being seen today for cough and congestion.  HPI: URI  This is a new problem. The current episode started in the past 7 days. The problem has been gradually worsening. There has been no fever. Associated symptoms include congestion, coughing, headaches, nausea, rhinorrhea (and post nasal drainage), a sore throat and wheezing. Pertinent negatives include no diarrhea, ear pain, plugged ear sensation, sinus pain or vomiting. Associated symptoms comments: chills. Treatments tried: advil cold and sinus, benadryl, advil. The treatment provided no relief.     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   Dyspnea 10/30/2018   Anxiety 07/20/2016   Thyroid nodule 07/20/2016    Allergies:  Allergies  Allergen Reactions   Sulfa Antibiotics    Sulfamethoxazole Rash and Hives   Medications:  Current Outpatient Medications:    albuterol (VENTOLIN HFA) 108 (90 Base) MCG/ACT inhaler, Inhale 1-2 puffs into the lungs every 6 (six) hours as needed., Disp: 8 g, Rfl: 0   ALPRAZolam (XANAX) 0.25 MG tablet, Take 1 tablet (0.25 mg total) by mouth 2 (two) times daily as needed  for anxiety., Disp: 30 tablet, Rfl: 1   ALPRAZolam (XANAX) 0.25 MG tablet, Take 1 tablet (0.25 mg total) by mouth 2 (two) times daily as needed for anxiety, Disp: 30 tablet, Rfl: 3   ALPRAZolam (XANAX) 0.25 MG tablet, Take 1 tablet (0.25 mg total) by mouth 2 (two) times daily as needed for anxiety, Disp: 30 tablet, Rfl: 3   ALPRAZolam (XANAX) 0.25 MG tablet, Take 1 tablet  (0.25 mg total) by mouth 2 (two) times daily as needed for anxiety, Disp: 30 tablet, Rfl: 3   ALPRAZolam (XANAX) 0.25 MG tablet, Take 1 tablet (0.25 mg total) by mouth 2 (two) times daily as needed for anxiety., Disp: 30 tablet, Rfl: 3   amphetamine-dextroamphetamine (ADDERALL XR) 20 MG 24 hr capsule, , Disp: , Rfl:    amphetamine-dextroamphetamine (ADDERALL) 20 MG tablet, Take 1 tablet (20 mg total) by mouth 2 (two) times daily. (Patient not taking: Reported on 06/17/2021), Disp: 60 tablet, Rfl: 0   benzonatate (TESSALON) 100 MG capsule, Take 1-2 capsules (100-200 mg total) by mouth 3 (three) times daily as needed., Disp: 30 capsule, Rfl: 0   bisacodyl (DULCOLAX) 5 MG EC tablet, Take by mouth as directed., Disp: 4 tablet, Rfl: 0   buPROPion (WELLBUTRIN XL) 150 MG 24 hr tablet, Take 150 mg by mouth every morning. (Patient not taking: Reported on 05/27/2021), Disp: , Rfl:    buPROPion (ZYBAN) 150 MG 12 hr tablet, , Disp: , Rfl:    busPIRone (BUSPAR) 15 MG tablet, Take 15 mg by mouth 2 (two) times daily., Disp: , Rfl:    estradiol (ESTRACE) 1 MG tablet, Take 1 tablet (1 mg total) by mouth daily., Disp: 90 tablet, Rfl: 3   hydrOXYzine (ATARAX) 25 MG tablet, Take 1 tablet (25 mg total) by mouth every 8 (eight) hours as needed., Disp: 90 tablet, Rfl: 6   hydrOXYzine (ATARAX/VISTARIL) 25 MG tablet, Take 1 tablet by mouth daily., Disp: , Rfl:    lisdexamfetamine (VYVANSE) 10 MG capsule, Take 10 mg by mouth daily. (Patient not taking: Reported on 05/27/2021), Disp: , Rfl:    loratadine (CLARITIN) 10 MG tablet, Take 10 mg by mouth daily., Disp: , Rfl:    medroxyPROGESTERone (PROVERA) 2.5 MG tablet, Take 1 tablet (2.5 mg total) by mouth daily., Disp: 90 tablet, Rfl: 3   Melatonin 10 MG TABS, Take 1 tablet by mouth daily., Disp: , Rfl:    methylphenidate (RITALIN) 10 MG tablet, Take 1 tablet (10 mg total) by mouth 2 (two) times daily on an empty stomach, Disp: 60 tablet, Rfl: 0   ondansetron (ZOFRAN-ODT) 4 MG  disintegrating tablet, Take 1 tablet (4 mg total) by mouth every 8 (eight) hours as needed., Disp: 20 tablet, Rfl: 0   polyethylene glycol-electrolytes (NULYTELY) 420 g solution, use as directed, Disp: 4000 mL, Rfl: 0   predniSONE (DELTASONE) 20 MG tablet, Take 2 tablets (40 mg total) by mouth daily with breakfast., Disp: 10 tablet, Rfl: 0   promethazine-dextromethorphan (PROMETHAZINE-DM) 6.25-15 MG/5ML syrup, Take 5 mLs by mouth 4 (four) times daily as needed., Disp: 118 mL, Rfl: 0   triamcinolone ointment (KENALOG) 0.5 %, Apply 1 application topically 2 (two) times daily as needed., Disp: 30 g, Rfl: 0  Observations/Objective: Patient is well-developed, well-nourished in no acute distress.  Resting comfortably  Head is normocephalic, atraumatic.  No labored breathing.  Speech is clear and coherent with logical content.  Patient is alert and oriented at baseline.    Assessment and Plan: 1. Viral URI with  cough - albuterol (VENTOLIN HFA) 108 (90 Base) MCG/ACT inhaler; Inhale 1-2 puffs into the lungs every 6 (six) hours as needed.  Dispense: 8 g; Refill: 0 - predniSONE (DELTASONE) 20 MG tablet; Take 2 tablets (40 mg total) by mouth daily with breakfast.  Dispense: 10 tablet; Refill: 0 - promethazine-dextromethorphan (PROMETHAZINE-DM) 6.25-15 MG/5ML syrup; Take 5 mLs by mouth 4 (four) times daily as needed.  Dispense: 118 mL; Refill: 0 - benzonatate (TESSALON) 100 MG capsule; Take 1-2 capsules (100-200 mg total) by mouth 3 (three) times daily as needed.  Dispense: 30 capsule; Refill: 0 - ondansetron (ZOFRAN-ODT) 4 MG disintegrating tablet; Take 1 tablet (4 mg total) by mouth every 8 (eight) hours as needed.  Dispense: 20 tablet; Refill: 0  2. Mild intermittent asthma with exacerbation - albuterol (VENTOLIN HFA) 108 (90 Base) MCG/ACT inhaler; Inhale 1-2 puffs into the lungs every 6 (six) hours as needed.  Dispense: 8 g; Refill: 0 - predniSONE (DELTASONE) 20 MG tablet; Take 2 tablets (40 mg  total) by mouth daily with breakfast.  Dispense: 10 tablet; Refill: 0  - Suspect Viral URI in setting of asthma patient causing asthma exacerbation - Will treat with Albuterol, Prednisone, Promethazine DM and Tessalon perles - Zofran for nausea - Can continue Mucinex  - Push fluids.  - Rest.  - Steam and humidifier can help - Seek in person evaluation if worsening or symptoms fail to improve    Follow Up Instructions: I discussed the assessment and treatment plan with the patient. The patient was provided an opportunity to ask questions and all were answered. The patient agreed with the plan and demonstrated an understanding of the instructions.  A copy of instructions were sent to the patient via MyChart unless otherwise noted below.    The patient was advised to call back or seek an in-person evaluation if the symptoms worsen or if the condition fails to improve as anticipated.    [name], PA-C

## 2022-11-14 NOTE — Patient Instructions (Signed)
Rhonda Hurst, thank you for joining Rhonda Loveless, PA-C for today's virtual visit.  While this provider is not your primary care provider (PCP), if your PCP is located in our provider database this encounter information will be shared with them immediately following your visit.   A [facility] MyChart account gives you access to today's visit and all your visits, tests, and labs performed at [facility] " click here if you don't have a [facility] MyChart account or go to [email or web address]  Consent: (Patient) Rhonda Hurst provided verbal consent for this virtual visit at the beginning of the encounter.  Current Medications:  Current Outpatient Medications:    albuterol (VENTOLIN HFA) 108 (90 Base) MCG/ACT inhaler, Inhale 1-2 puffs into the lungs every 6 (six) hours as needed., Disp: 8 g, Rfl: 0   ALPRAZolam (XANAX) 0.25 MG tablet, Take 1 tablet (0.25 mg total) by mouth 2 (two) times daily as needed for anxiety., Disp: 30 tablet, Rfl: 1   ALPRAZolam (XANAX) 0.25 MG tablet, Take 1 tablet (0.25 mg total) by mouth 2 (two) times daily as needed for anxiety, Disp: 30 tablet, Rfl: 3   ALPRAZolam (XANAX) 0.25 MG tablet, Take 1 tablet (0.25 mg total) by mouth 2 (two) times daily as needed for anxiety, Disp: 30 tablet, Rfl: 3   ALPRAZolam (XANAX) 0.25 MG tablet, Take 1 tablet (0.25 mg total) by mouth 2 (two) times daily as needed for anxiety, Disp: 30 tablet, Rfl: 3   ALPRAZolam (XANAX) 0.25 MG tablet, Take 1 tablet (0.25 mg total) by mouth 2 (two) times daily as needed for anxiety., Disp: 30 tablet, Rfl: 3   amphetamine-dextroamphetamine (ADDERALL XR) 20 MG 24 hr capsule, , Disp: , Rfl:    amphetamine-dextroamphetamine (ADDERALL) 20 MG tablet, Take 1 tablet (20 mg total) by mouth 2 (two) times daily. (Patient not taking: Reported on 06/17/2021), Disp: 60 tablet, Rfl: 0   benzonatate (TESSALON) 100 MG capsule, Take 1-2 capsules (100-200 mg total) by mouth 3 (three) times daily as  needed., Disp: 30 capsule, Rfl: 0   bisacodyl (DULCOLAX) 5 MG EC tablet, Take by mouth as directed., Disp: 4 tablet, Rfl: 0   buPROPion (WELLBUTRIN XL) 150 MG 24 hr tablet, Take 150 mg by mouth every morning. (Patient not taking: Reported on 05/27/2021), Disp: , Rfl:    buPROPion (ZYBAN) 150 MG 12 hr tablet, , Disp: , Rfl:    busPIRone (BUSPAR) 15 MG tablet, Take 15 mg by mouth 2 (two) times daily., Disp: , Rfl:    estradiol (ESTRACE) 1 MG tablet, Take 1 tablet (1 mg total) by mouth daily., Disp: 90 tablet, Rfl: 3   hydrOXYzine (ATARAX) 25 MG tablet, Take 1 tablet (25 mg total) by mouth every 8 (eight) hours as needed., Disp: 90 tablet, Rfl: 6   hydrOXYzine (ATARAX/VISTARIL) 25 MG tablet, Take 1 tablet by mouth daily., Disp: , Rfl:    lisdexamfetamine (VYVANSE) 10 MG capsule, Take 10 mg by mouth daily. (Patient not taking: Reported on 05/27/2021), Disp: , Rfl:    loratadine (CLARITIN) 10 MG tablet, Take 10 mg by mouth daily., Disp: , Rfl:    medroxyPROGESTERone (PROVERA) 2.5 MG tablet, Take 1 tablet (2.5 mg total) by mouth daily., Disp: 90 tablet, Rfl: 3   Melatonin 10 MG TABS, Take 1 tablet by mouth daily., Disp: , Rfl:    methylphenidate (RITALIN) 10 MG tablet, Take 1 tablet (10 mg total) by mouth 2 (two) times daily on an empty stomach, Disp: 60 tablet, Rfl: 0  ondansetron (ZOFRAN-ODT) 4 MG disintegrating tablet, Take 1 tablet (4 mg total) by mouth every 8 (eight) hours as needed., Disp: 20 tablet, Rfl: 0   polyethylene glycol-electrolytes (NULYTELY) 420 g solution, use as directed, Disp: 4000 mL, Rfl: 0   predniSONE (DELTASONE) 20 MG tablet, Take 2 tablets (40 mg total) by mouth daily with breakfast., Disp: 10 tablet, Rfl: 0   promethazine-dextromethorphan (PROMETHAZINE-DM) 6.25-15 MG/5ML syrup, Take 5 mLs by mouth 4 (four) times daily as needed., Disp: 118 mL, Rfl: 0   triamcinolone ointment (KENALOG) 0.5 %, Apply 1 application topically 2 (two) times daily as needed., Disp: 30 g, Rfl: 0    Medications ordered in this encounter:  Meds ordered this encounter  Medications   albuterol (VENTOLIN HFA) 108 (90 Base) MCG/ACT inhaler    Sig: Inhale 1-2 puffs into the lungs every 6 (six) hours as needed.    Dispense:  8 g    Refill:  0    Order Specific Question:   Supervising Provider    Answer:   Merrilee Jansky [[id number]]   predniSONE (DELTASONE) 20 MG tablet    Sig: Take 2 tablets (40 mg total) by mouth daily with breakfast.    Dispense:  10 tablet    Refill:  0    Order Specific Question:   Supervising Provider    Answer:   Merrilee Jansky [[id number]]   promethazine-dextromethorphan (PROMETHAZINE-DM) 6.25-15 MG/5ML syrup    Sig: Take 5 mLs by mouth 4 (four) times daily as needed.    Dispense:  118 mL    Refill:  0    Order Specific Question:   Supervising Provider    Answer:   Merrilee Jansky [[id number]]   benzonatate (TESSALON) 100 MG capsule    Sig: Take 1-2 capsules (100-200 mg total) by mouth 3 (three) times daily as needed.    Dispense:  30 capsule    Refill:  0    Order Specific Question:   Supervising Provider    Answer:   LAMPTEY, PHILIP O [[id number]]   ondansetron (ZOFRAN-ODT) 4 MG disintegrating tablet    Sig: Take 1 tablet (4 mg total) by mouth every 8 (eight) hours as needed.    Dispense:  20 tablet    Refill:  0    Order Specific Question:   Supervising Provider    Answer:   Merrilee Jansky [id number]     *If you need refills on other medications prior to your next appointment, please contact your pharmacy*  Follow-Up: Call back or seek an in-person evaluation if the symptoms worsen or if the condition fails to improve as anticipated.  [facility] [phone]  Other Instructions Acute Bronchitis, Adult  Acute bronchitis is sudden inflammation of the main airways (bronchi) that come off the windpipe (trachea) in the lungs. The swelling causes the airways to get smaller and make more mucus than normal. This can make it hard to  breathe and can cause coughing or noisy breathing (wheezing). Acute bronchitis may last several weeks. The cough may last longer. Allergies, asthma, and exposure to smoke may make the condition worse. What are the causes? This condition can be caused by germs and by substances that irritate the lungs, including: Cold and flu viruses. The most common cause of this condition is the virus that causes the common cold. Bacteria. This is less common. Breathing in substances that irritate the lungs, including: Smoke from cigarettes and other forms of tobacco. Dust and pollen. Fumes  from household cleaning products, gases, or burned fuel. Indoor or outdoor air pollution. What increases the risk? The following factors may make you more likely to develop this condition: A weak body's defense system, also called the immune system. A condition that affects your lungs and breathing, such as asthma. What are the signs or symptoms? Common symptoms of this condition include: Coughing. This may bring up clear, yellow, or green mucus from your lungs (sputum). Wheezing. Runny or stuffy nose. Having too much mucus in your lungs (chest congestion). Shortness of breath. Aches and pains, including sore throat or chest. How is this diagnosed? This condition is usually diagnosed based on: Your symptoms and medical history. A physical exam. You may also have other tests, including tests to rule out other conditions, such as pneumonia. These tests include: A test of lung function. Test of a mucus sample to look for the presence of bacteria. Tests to check the oxygen level in your blood. Blood tests. Chest X-ray. How is this treated? Most cases of acute bronchitis clear up over time without treatment. Your health care provider may recommend: Drinking more fluids to help thin your mucus so it is easier to cough up. Taking inhaled medicine (inhaler) to improve air flow in and out of your lungs. Using a  vaporizer or a humidifier. These are machines that add water to the air to help you breathe better. Taking a medicine that thins mucus and clears congestion (expectorant). Taking a medicine that prevents or stops coughing (cough suppressant). It is not common to take an antibiotic medicine for this condition. Follow these instructions at home:  Take over-the-counter and prescription medicines only as told by your health care provider. Use an inhaler, vaporizer, or humidifier as told by your health care provider. Take two teaspoons (10 mL) of honey at bedtime to lessen coughing at night. Drink enough fluid to keep your urine pale yellow. Do not use any products that contain nicotine or tobacco. These products include cigarettes, chewing tobacco, and vaping devices, such as e-cigarettes. If you need help quitting, ask your health care provider. Get plenty of rest. Return to your normal activities as told by your health care provider. Ask your health care provider what activities are safe for you. Keep all follow-up visits. This is important. How is this prevented? To lower your risk of getting this condition again: Wash your hands often with soap and water for at least 20 seconds. If soap and water are not available, use hand sanitizer. Avoid contact with people who have cold symptoms. Try not to touch your mouth, nose, or eyes with your hands. Avoid breathing in smoke or chemical fumes. Breathing smoke or chemical fumes will make your condition worse. Get the flu shot every year. Contact a health care provider if: Your symptoms do not improve after 2 weeks. You have trouble coughing up the mucus. Your cough keeps you awake at night. You have a fever. Get help right away if you: Cough up blood. Feel pain in your chest. Have severe shortness of breath. Faint or keep feeling like you are going to faint. Have a severe headache. Have a fever or chills that get worse. These symptoms may  represent a serious problem that is an emergency. Do not wait to see if the symptoms will go away. Get medical help right away. Call your local emergency services (911 in the [place] Do not drive yourself to the hospital. Summary Acute bronchitis is inflammation of the main airways (bronchi) that  come off the windpipe (trachea) in the lungs. The swelling causes the airways to get smaller and make more mucus than normal. Drinking more fluids can help thin your mucus so it is easier to cough up. Take over-the-counter and prescription medicines only as told by your health care provider. Do not use any products that contain nicotine or tobacco. These products include cigarettes, chewing tobacco, and vaping devices, such as e-cigarettes. If you need help quitting, ask your health care provider. Contact a health care provider if your symptoms do not improve after 2 weeks. This information is not intended to replace advice given to you by your health care provider. Make sure you discuss any questions you have with your health care provider. Document Revised: 04/08/2021 Document Reviewed: 04/29/2020 Elsevier Patient Education  2024 [employer]    If you have been instructed to have an in-person evaluation today at a local Urgent Care facility, please use the link below. It will take you to a list of all of our available [facility], including address, phone number and hours of operation. Please do not delay care.  [facility]  If you or a family member do not have a primary care provider, use the link below to schedule a visit and establish care. When you choose a [facility] primary care physician or advanced practice provider, you gain a long-term partner in health. Find a Primary Care Provider  Learn more about [facility]'s in-office and virtual care options: [facility] - Get Care Now

## 2022-11-18 ENCOUNTER — Telehealth: Payer: 59 | Admitting: Nurse Practitioner

## 2022-11-18 DIAGNOSIS — J0140 Acute pansinusitis, unspecified: Secondary | ICD-10-CM | POA: Diagnosis not present

## 2022-11-18 MED ORDER — FLUTICASONE PROPIONATE 50 MCG/ACT NA SUSP
2.0000 | Freq: Every day | NASAL | 6 refills | Status: AC
Start: 2022-11-18 — End: ?

## 2022-11-18 MED ORDER — AMOXICILLIN-POT CLAVULANATE 875-125 MG PO TABS
1.0000 | ORAL_TABLET | Freq: Two times a day (BID) | ORAL | 0 refills | Status: AC
Start: 2022-11-18 — End: 2022-11-25

## 2022-11-18 NOTE — Progress Notes (Signed)
Virtual Visit Consent   Ive Ceesay, you are scheduled for a virtual visit with a [facility] provider today. Just as with appointments in the office, your consent must be obtained to participate. Your consent will be active for this visit and any virtual visit you may have with one of our providers in the next 365 days. If you have a MyChart account, a copy of this consent can be sent to you electronically.  As this is a virtual visit, video technology does not allow for your provider to perform a traditional examination. This may limit your provider's ability to fully assess your condition. If your provider identifies any concerns that need to be evaluated in person or the need to arrange testing (such as labs, EKG, etc.), we will make arrangements to do so. Although advances in technology are sophisticated, we cannot ensure that it will always work on either your end or our end. If the connection with a video visit is poor, the visit may have to be switched to a telephone visit. With either a video or telephone visit, we are not always able to ensure that we have a secure connection.  By engaging in this virtual visit, you consent to the provision of healthcare and authorize for your insurance to be billed (if applicable) for the services provided during this visit. Depending on your insurance coverage, you may receive a charge related to this service.  I need to obtain your verbal consent now. Are you willing to proceed with your visit today? Kamoree Loftus has provided verbal consent on 11/18/2022 for a virtual visit (video or telephone). Viviano Simas, FNP  Date: 11/18/2022 1:33 PM  Virtual Visit via Video Note   I, Viviano Simas, connected with  Jeleesa Tiernan  ([id number], 03-25-1980) on 11/18/22 at  1:45 PM EST by a video-enabled telemedicine application and verified that I am speaking with the correct person using two identifiers.  Location: Patient: Virtual Visit Location Patient:  Home Provider: Virtual Visit Location Provider: Home Office   I discussed the limitations of evaluation and management by telemedicine and the availability of in person appointments. The patient expressed understanding and agreed to proceed.    History of Present Illness: Rhonda Hurst is a 42 y.o. who identifies as a female who was assigned female at birth, and is being seen today for cough and congestion. She had a visit on 11/14/22 was given albuterol, prednisone, promethazine-dm and zofran at that time   She has used the Albuterol 3-4 times a day since that time  Continues to have drainage and ongoing congestion  Patient has been using the prescribed cough medication and the prednisone without relief   She has started to feel improvement in her chest but her sinus congestion has worsened  Has also started to run a low grade fever   She does not use nasal sprays currently    Denies pregnancy at this time   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   Dyspnea 10/30/2018   Anxiety 07/20/2016   Thyroid nodule 07/20/2016    Allergies:  Allergies  Allergen Reactions   Sulfa Antibiotics    Sulfamethoxazole Rash and Hives   Medications:  Current Outpatient Medications:    albuterol (VENTOLIN HFA) 108 (90 Base) MCG/ACT inhaler, Inhale 1-2 puffs into the lungs every 6 (six) hours as needed., Disp: 6.7 g, Rfl: 0   ALPRAZolam (XANAX) 0.25 MG  tablet, Take 1 tablet (0.25 mg total) by mouth 2 (two) times daily as needed for anxiety., Disp: 30 tablet, Rfl: 1   ALPRAZolam (XANAX) 0.25 MG tablet, Take 1 tablet (0.25 mg total) by mouth 2 (two) times daily as needed for anxiety, Disp: 30 tablet, Rfl: 3   ALPRAZolam (XANAX) 0.25 MG tablet, Take 1 tablet (0.25 mg total) by mouth 2 (two) times daily as needed for anxiety, Disp: 30 tablet, Rfl: 3   ALPRAZolam (XANAX) 0.25 MG tablet, Take 1 tablet  (0.25 mg total) by mouth 2 (two) times daily as needed for anxiety, Disp: 30 tablet, Rfl: 3   ALPRAZolam (XANAX) 0.25 MG tablet, Take 1 tablet (0.25 mg total) by mouth 2 (two) times daily as needed for anxiety., Disp: 30 tablet, Rfl: 3   amphetamine-dextroamphetamine (ADDERALL XR) 20 MG 24 hr capsule, , Disp: , Rfl:    amphetamine-dextroamphetamine (ADDERALL) 20 MG tablet, Take 1 tablet (20 mg total) by mouth 2 (two) times daily. (Patient not taking: Reported on 06/17/2021), Disp: 60 tablet, Rfl: 0   benzonatate (TESSALON) 100 MG capsule, Take 1-2 capsules (100-200 mg total) by mouth 3 (three) times daily as needed., Disp: 30 capsule, Rfl: 0   bisacodyl (DULCOLAX) 5 MG EC tablet, Take by mouth as directed., Disp: 4 tablet, Rfl: 0   buPROPion (WELLBUTRIN XL) 150 MG 24 hr tablet, Take 150 mg by mouth every morning. (Patient not taking: Reported on 05/27/2021), Disp: , Rfl:    buPROPion (ZYBAN) 150 MG 12 hr tablet, , Disp: , Rfl:    busPIRone (BUSPAR) 15 MG tablet, Take 15 mg by mouth 2 (two) times daily., Disp: , Rfl:    estradiol (ESTRACE) 1 MG tablet, Take 1 tablet (1 mg total) by mouth daily., Disp: 90 tablet, Rfl: 3   hydrOXYzine (ATARAX) 25 MG tablet, Take 1 tablet (25 mg total) by mouth every 8 (eight) hours as needed., Disp: 90 tablet, Rfl: 6   hydrOXYzine (ATARAX/VISTARIL) 25 MG tablet, Take 1 tablet by mouth daily., Disp: , Rfl:    lisdexamfetamine (VYVANSE) 10 MG capsule, Take 10 mg by mouth daily. (Patient not taking: Reported on 05/27/2021), Disp: , Rfl:    loratadine (CLARITIN) 10 MG tablet, Take 10 mg by mouth daily., Disp: , Rfl:    medroxyPROGESTERone (PROVERA) 2.5 MG tablet, Take 1 tablet (2.5 mg total) by mouth daily., Disp: 90 tablet, Rfl: 3   Melatonin 10 MG TABS, Take 1 tablet by mouth daily., Disp: , Rfl:    methylphenidate (RITALIN) 10 MG tablet, Take 1 tablet (10 mg total) by mouth 2 (two) times daily on an empty stomach, Disp: 60 tablet, Rfl: 0   ondansetron (ZOFRAN-ODT) 4 MG  disintegrating tablet, Take 1 tablet (4 mg total) by mouth every 8 (eight) hours as needed., Disp: 20 tablet, Rfl: 0   polyethylene glycol-electrolytes (NULYTELY) 420 g solution, use as directed, Disp: 4000 mL, Rfl: 0   predniSONE (DELTASONE) 20 MG tablet, Take 2 tablets (40 mg total) by mouth daily with breakfast., Disp: 10 tablet, Rfl: 0   promethazine-dextromethorphan (PROMETHAZINE-DM) 6.25-15 MG/5ML syrup, Take 5 mLs by mouth 4 (four) times daily as needed., Disp: 118 mL, Rfl: 0   triamcinolone ointment (KENALOG) 0.5 %, Apply 1 application topically 2 (two) times daily as needed., Disp: 30 g, Rfl: 0  Observations/Objective: Patient is well-developed, well-nourished in no acute distress.  Resting comfortably  at home.  Head is normocephalic, atraumatic.  No labored breathing.  Speech is clear and coherent with logical content.  Patient is alert and oriented at baseline.    Assessment and Plan:  1. Acute non-recurrent pansinusitis May continue previous regimen in addition to:  - amoxicillin-clavulanate (AUGMENTIN) 875-125 MG tablet; Take 1 tablet by mouth 2 (two) times daily for 7 days. Take with food  Dispense: 14 tablet; Refill: 0 - fluticasone (FLONASE) 50 MCG/ACT nasal spray; Place 2 sprays into both nostrils daily.  Dispense: 16 g; Refill: 6    Follow Up Instructions: I discussed the assessment and treatment plan with the patient. The patient was provided an opportunity to ask questions and all were answered. The patient agreed with the plan and demonstrated an understanding of the instructions.  A copy of instructions were sent to the patient via MyChart unless otherwise noted below.     The patient was advised to call back or seek an in-person evaluation if the symptoms worsen or if the condition fails to improve as anticipated.    Viviano Simas, FNP

## 2022-12-01 ENCOUNTER — Other Ambulatory Visit (HOSPITAL_COMMUNITY): Payer: 59

## 2022-12-02 ENCOUNTER — Other Ambulatory Visit (HOSPITAL_COMMUNITY)
Admission: RE | Admit: 2022-12-02 | Discharge: 2022-12-02 | Disposition: A | Discharge status: Home / Self Care | Payer: 59 | Source: Ambulatory Visit | Attending: Oncology | Admitting: Oncology

## 2022-12-02 DIAGNOSIS — Z006 Encounter for examination for normal comparison and control in clinical research program: Secondary | ICD-10-CM | POA: Insufficient documentation

## 2022-12-18 LAB — GENECONNECT MOLECULAR SCREEN: Genetic Analysis Overall Interpretation: NEGATIVE

## 2022-12-19 DIAGNOSIS — G5603 Carpal tunnel syndrome, bilateral upper limbs: Secondary | ICD-10-CM | POA: Diagnosis not present

## 2022-12-27 ENCOUNTER — Other Ambulatory Visit: Payer: Self-pay

## 2022-12-27 IMAGING — US US EXTREM LOW VENOUS*R*
1 series · 12 of 24 positions shown · non-contrast
Comparison: CT [facility], 06/19/2015.
COMPARISON: None.
COMPARISON: CT [facility], 06/19/2015.

Addendum:
CLINICAL DATA: Varicosities.

EXAM:
RIGHT LOWER EXTREMITY VENOUS DOPPLER ULTRASOUND
TECHNIQUE: Gray-scale sonography with compression, as well as color and duplex
ultrasound, were performed to evaluate the deep venous system(s)
from the level of the common femoral vein through the popliteal and
proximal calf veins.
CLINICAL DATA: 40-year-old female with right greater than left
intermittent lower extremity swelling, erythema, and pruritus.
Right LOWER EXTREMITY VENOUS DUPLEX ULTRASOUND
TECHNIQUE: Gray-scale sonography with graded compression, as well as color
Doppler and duplex ultrasound, were performed to evaluate the deep
and superficial veins of the right lower extremity. Spectral Doppler
was utilized to evaluate flow at rest and with distal augmentation
maneuvers. A complete superficial venous insufficiency exam was
performed in the upright standing. I personally performed the
technical portion of the exam.

[Series 1: us extrem low venous*right* · 0.07mm/px · 12 of 40 slices shown]
[im 2/40]
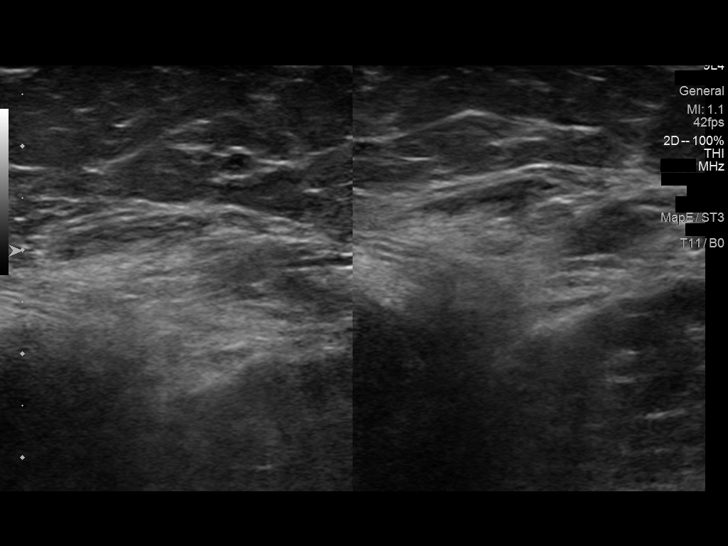
[im 6/40]
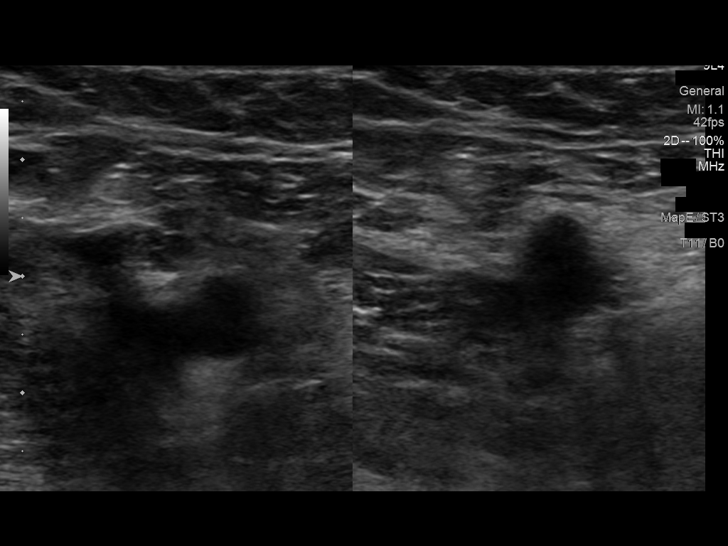
[im 9/40]
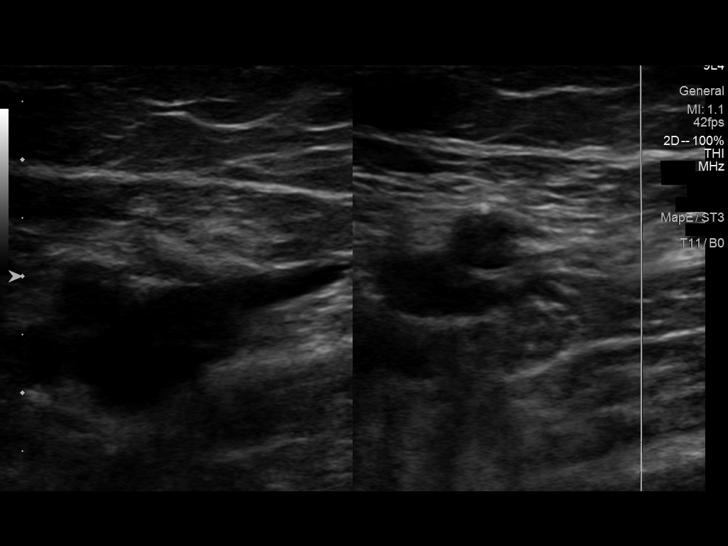
[im 12/40]
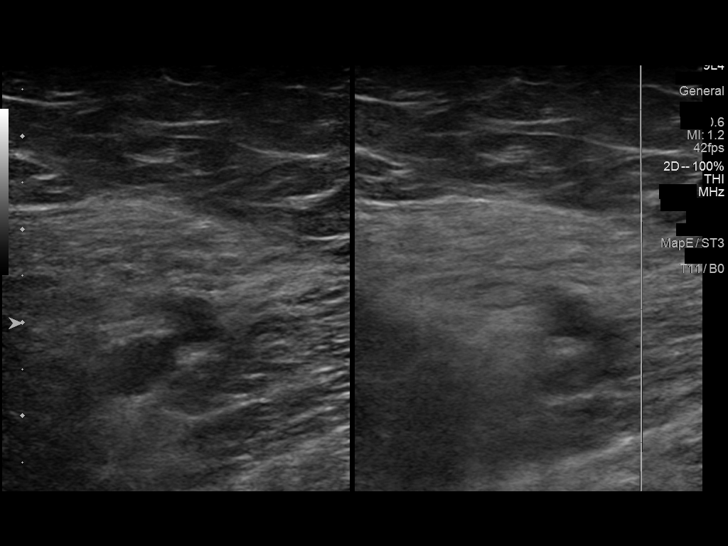
[im 16/40]
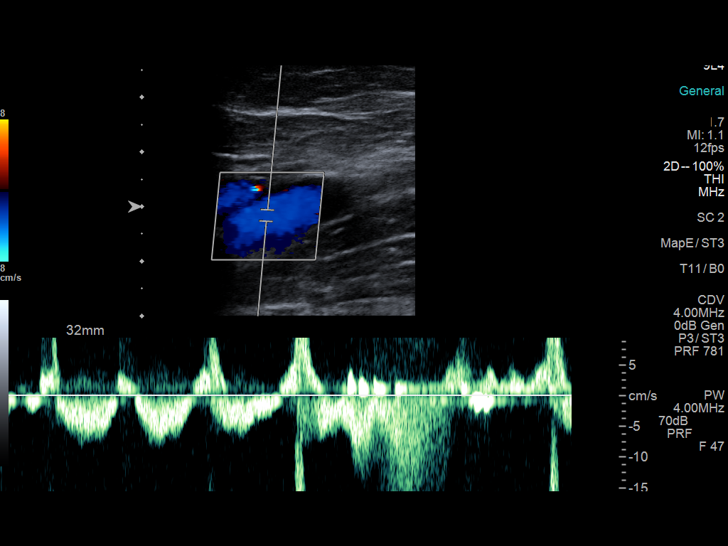
[im 19/40]
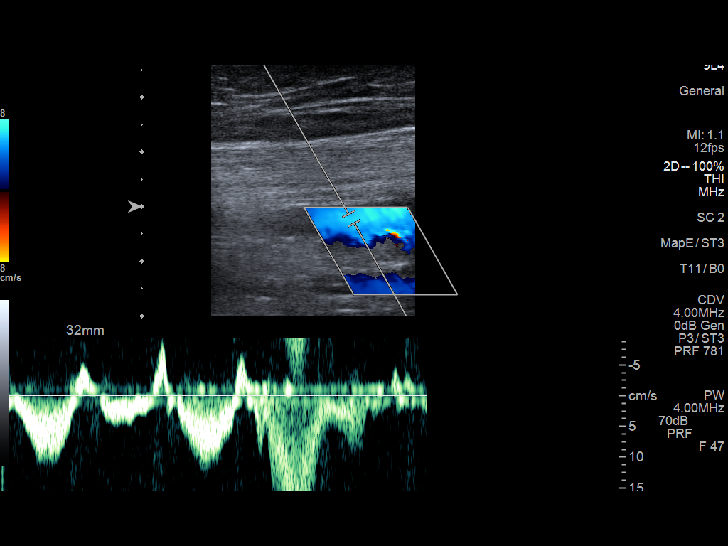
[im 23/40]
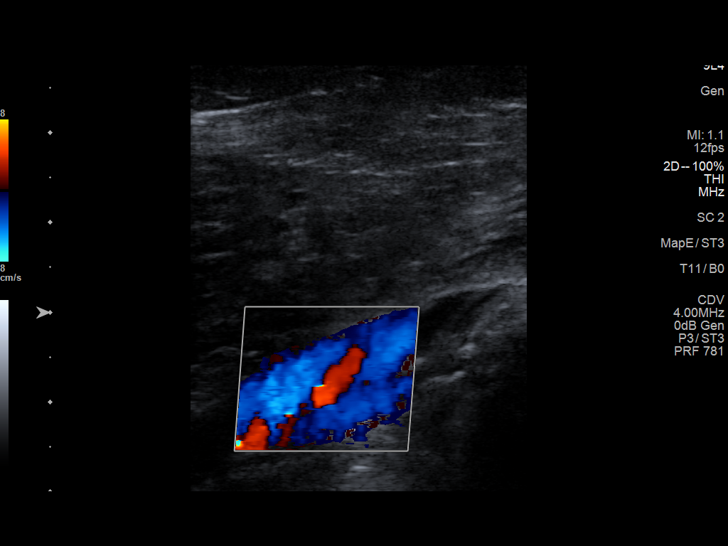
[im 26/40]
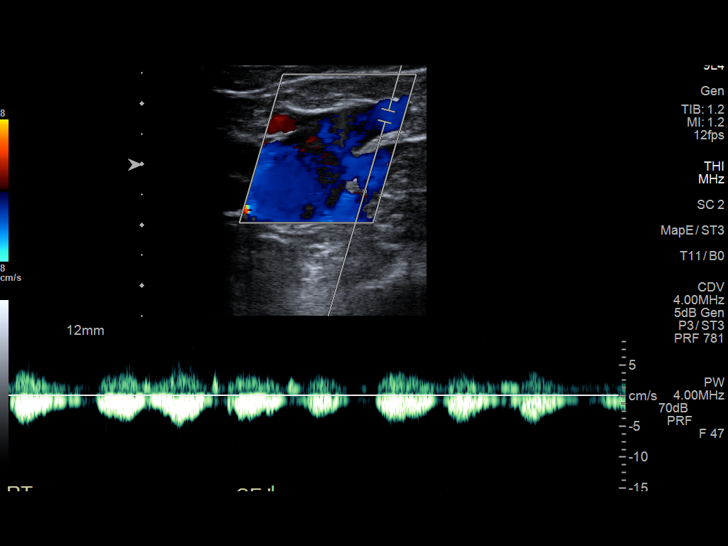
[im 29/40]
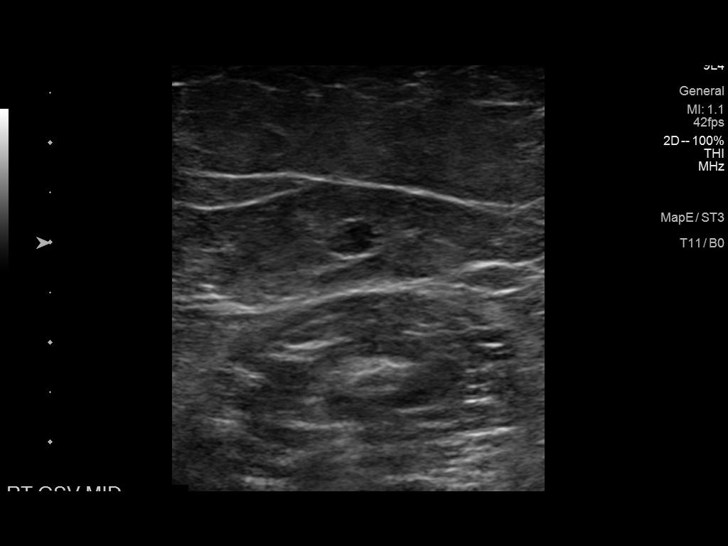
[im 33/40]
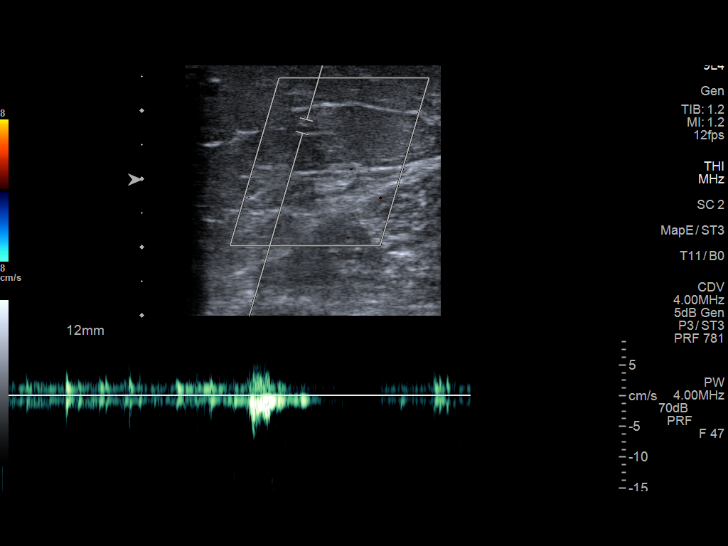
[im 36/40]
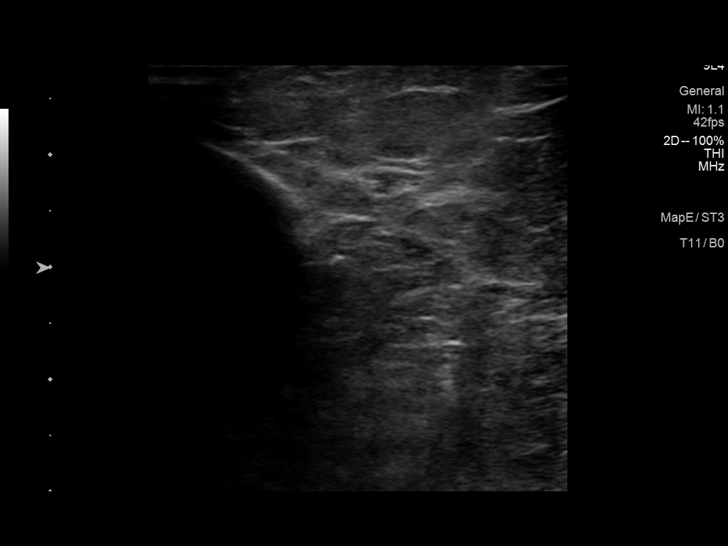
[im 40/40]
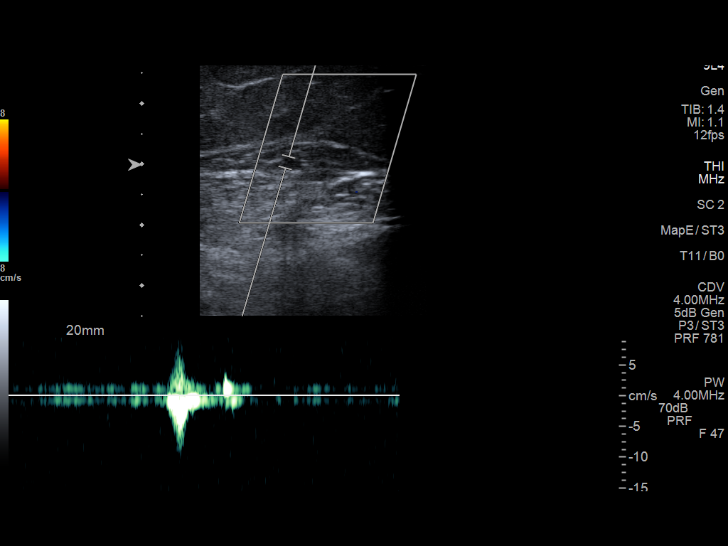

[12 of 24 positions shown; findings below may reference images not displayed]

FINDINGS: VENOUS

Normal compressibility of the RIGHT common femoral, superficial
femoral, and popliteal veins, as well as the visualized calf veins.
Visualized portions of profunda femoral vein and great saphenous
vein unremarkable. No filling defects to suggest DVT on grayscale or
color Doppler imaging. Doppler waveforms show normal direction of
venous flow, normal respiratory plasticity and response to
augmentation.

Limited views of the contralateral common femoral vein are
unremarkable.

OTHER

No evidence of superficial thrombophlebitis or abnormal fluid
collection.

Additional investigations of the superficial venous system,
including the GSV and SSV stations, however difficult to ascertain
reflux time on this evaluation.

Limitations: none
IMPRESSION: 1. No evidence of femoropopliteal DVT within the RIGHT lower
extremity.
2. No evidence of superficial thrombophlebitis. Reflux time is
difficult to ascertain on this evaluation.
FINDINGS: Deep Venous System:

Evaluation of the deep venous system including the common femoral,
femoral, profunda femoral, popliteal and calf veins (where visible)
demonstrate no evidence of deep venous thrombosis. The vessels are
compressible and demonstrate normal respiratory phasicity and
response to augmentation. No evidence of deep venous reflux.

Superficial Venous System:

SFJ: Patent, normal caliber, no evidence of reflux.

GSV Prox Thigh: Patent, normal caliber, no evidence of reflux.

GSV Mid Thigh: Patent, normal caliber, no evidence of reflux.

GSV Lower Thigh: Patent, normal caliber, no evidence of reflux.

GSV Prox Calf: Patent, normal caliber, no evidence of reflux.

GSV Mid Calf: Patent, normal caliber, no evidence of reflux.

GSV Distal Calf: Patent, normal caliber, no evidence of reflux.

SPJ: Patent, normal caliber, no evidence of reflux.

SSV Prox: Patent, normal caliber, no evidence of reflux.

SSV Mid: Patent, normal caliber, no evidence of reflux.

SSV Distal: Patent, normal caliber, no evidence of reflux.

Other: There is a tiny perforating, subcutaneous varicose vein
arising from the mid calf greater saphenous vein.
IMPRESSION: No evidence of right lower extremity chronic venous insufficiency.
Tiny right mid calf greater saphenous vein perforating varicosity,
unlikely to be clinically significant.

*** End of Addendum ***
FINDINGS: VENOUS

Normal compressibility of the RIGHT common femoral, superficial
femoral, and popliteal veins, as well as the visualized calf veins.
Visualized portions of profunda femoral vein and great saphenous
vein unremarkable. No filling defects to suggest DVT on grayscale or
color Doppler imaging. Doppler waveforms show normal direction of
venous flow, normal respiratory plasticity and response to
augmentation.

Limited views of the contralateral common femoral vein are
unremarkable.

OTHER

No evidence of superficial thrombophlebitis or abnormal fluid
collection.

Additional investigations of the superficial venous system,
including the GSV and SSV stations, however difficult to ascertain
reflux time on this evaluation.

Limitations: none
IMPRESSION: 1. No evidence of femoropopliteal DVT within the RIGHT lower
extremity.
2. No evidence of superficial thrombophlebitis. Reflux time is
difficult to ascertain on this evaluation.

## 2023-01-05 DIAGNOSIS — G5603 Carpal tunnel syndrome, bilateral upper limbs: Secondary | ICD-10-CM | POA: Diagnosis not present

## 2023-01-12 DIAGNOSIS — G5603 Carpal tunnel syndrome, bilateral upper limbs: Secondary | ICD-10-CM | POA: Diagnosis not present

## 2023-01-18 DIAGNOSIS — G5603 Carpal tunnel syndrome, bilateral upper limbs: Secondary | ICD-10-CM | POA: Diagnosis not present

## 2023-01-26 ENCOUNTER — Other Ambulatory Visit: Payer: Self-pay

## 2023-02-02 ENCOUNTER — Other Ambulatory Visit (HOSPITAL_COMMUNITY): Payer: Self-pay

## 2023-02-07 ENCOUNTER — Encounter (HOSPITAL_BASED_OUTPATIENT_CLINIC_OR_DEPARTMENT_OTHER): Payer: Self-pay

## 2023-02-07 ENCOUNTER — Other Ambulatory Visit (HOSPITAL_BASED_OUTPATIENT_CLINIC_OR_DEPARTMENT_OTHER): Payer: Self-pay

## 2023-02-07 ENCOUNTER — Ambulatory Visit (HOSPITAL_BASED_OUTPATIENT_CLINIC_OR_DEPARTMENT_OTHER)
Admission: RE | Admit: 2023-02-07 | Discharge: 2023-02-07 | Disposition: A | Discharge status: Home / Self Care | Payer: 59 | Source: Ambulatory Visit | Attending: Physician Assistant | Admitting: Physician Assistant

## 2023-02-07 DIAGNOSIS — J4521 Mild intermittent asthma with (acute) exacerbation: Secondary | ICD-10-CM

## 2023-02-07 DIAGNOSIS — J069 Acute upper respiratory infection, unspecified: Secondary | ICD-10-CM

## 2023-02-07 LAB — POC COVID19/FLU A&B COMBO
Covid Antigen, POC: NEGATIVE
Influenza A Antigen, POC: NEGATIVE
Influenza B Antigen, POC: NEGATIVE

## 2023-02-07 MED ORDER — BENZONATATE 100 MG PO CAPS
100.0000 mg | ORAL_CAPSULE | Freq: Three times a day (TID) | ORAL | 0 refills | Status: DC
  Filled 2023-02-07: qty 21, 7d supply, fill #0

## 2023-02-07 MED ORDER — ALBUTEROL SULFATE HFA 108 (90 BASE) MCG/ACT IN AERS
1.0000 | INHALATION_SPRAY | Freq: Four times a day (QID) | RESPIRATORY_TRACT | 0 refills | Status: AC | PRN
Start: 2023-02-07 — End: ?
  Filled 2023-02-07: qty 6.7, 17d supply, fill #0

## 2023-02-07 MED ORDER — PROMETHAZINE-DM 6.25-15 MG/5ML PO SYRP
5.0000 mL | ORAL_SOLUTION | Freq: Two times a day (BID) | ORAL | 0 refills | Status: DC | PRN
  Filled 2023-02-07: qty 118, 12d supply, fill #0

## 2023-02-07 NOTE — Discharge Instructions (Signed)
You tested negative for flu and COVID but I suspect you have a different virus.  Use Tessalon for cough during the day and Promethazine DM for cough at night.  This will make you sleepy so do not drive drink alcohol while taking it.  Use albuterol every 4-6 hours as needed.  Use over-the-counter medications including Mucinex, Flonase, Tylenol for symptom relief.  Make sure you rest and drink plenty of fluid.  If you have any worsening symptoms including shortness of breath, chest pain, fever not responding to medication, weakness, nausea/vomiting interfere with oral intake you need to be seen immediately.  If symptoms are not improving by next week return here or see your PCP.

## 2023-02-07 NOTE — ED Triage Notes (Signed)
Woke up with cough, runny nose, headache. Works at [employer] in [employer].

## 2023-02-07 NOTE — ED Provider Notes (Signed)
[facility]    CSN: [id number] Arrival date & time: 02/07/23  0859      History   Chief Complaint Chief Complaint  Patient presents with   Cough   Nasal Congestion   Headache    HPI Rhonda Hurst is a 43 y.o. female.   Patient presents today with a 12-hour history of URI symptoms.  She reports burning in her chest, chest tightness, cough, congestion, malaise.  Denies any fever, shortness of breath, nausea, vomiting, diarrhea.  Denies any known sick contacts but does work in Teacher, music.  She is up-to-date on influenza vaccination and has had initial COVID-19 vaccines.  She has had COVID several years ago.  She denies formal diagnosis of asthma but has required an albuterol inhaler several times when she is sick; she is requesting a refill of this today.  Denies hospitalization related to asthma.  She was last treated with antibiotics in November 2024 and denies any more recent antibiotic use.  Denies any recent prednisone or steroid use.  She has tried ibuprofen without improvement of symptoms.  She has no concern for pregnancy.    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      Home Medications    Prior to Admission medications   Medication Sig Start Date End Date Taking? Authorizing Provider  benzonatate (TESSALON) 100 MG capsule Take 1 capsule (100 mg total) by mouth every 8 (eight) hours. 02/07/23  Yes Keddrick Wyne K, PA-C  promethazine-dextromethorphan (PROMETHAZINE-DM) 6.25-15 MG/5ML syrup Take 5 mLs by mouth 2 (two) times daily as needed  for cough. 02/07/23  Yes Louden Houseworth K, PA-C  albuterol (VENTOLIN HFA) 108 (90 Base) MCG/ACT inhaler Inhale 1-2 puffs into the lungs every 6 (six) hours as needed. 02/07/23   Brandi Tomlinson, Noberto Retort, PA-C  ALPRAZolam (XANAX) 0.25 MG tablet Take 1 tablet (0.25 mg total) by mouth 2 (two) times daily as needed for anxiety. 03/15/21     ALPRAZolam (XANAX) 0.25 MG tablet Take 1 tablet (0.25 mg total) by mouth 2 (two) times daily as needed for anxiety 07/28/21     ALPRAZolam (XANAX) 0.25 MG tablet Take 1 tablet (0.25 mg total) by mouth 2 (two) times daily as needed for anxiety 11/02/21     ALPRAZolam (XANAX) 0.25 MG tablet Take 1 tablet (0.25 mg total) by mouth 2 (two) times daily as needed for anxiety 12/21/21     ALPRAZolam (XANAX) 0.25 MG tablet Take 1 tablet (0.25 mg total) by mouth 2 (two) times daily as needed for anxiety. 11/07/22     amphetamine-dextroamphetamine (ADDERALL XR) 20 MG 24 hr capsule  10/11/19   [provider]  amphetamine-dextroamphetamine (ADDERALL) 20 MG tablet Take 1 tablet (20 mg total) by mouth 2 (two) times daily. Patient not taking: Reported on 06/17/2021 03/24/21     bisacodyl (DULCOLAX) 5 MG EC tablet Take by mouth as directed. 09/13/22     busPIRone (BUSPAR) 15 MG tablet Take 15 mg by mouth 2 (  two) times daily. 12/24/19   [provider]  estradiol (ESTRACE) 1 MG tablet Take 1 tablet (1 mg total) by mouth daily. 09/02/22     fluticasone (FLONASE) 50 MCG/ACT nasal spray Place 2 sprays into both nostrils daily. 11/18/22   Viviano Simas, FNP  hydrOXYzine (ATARAX) 25 MG tablet Take 1 tablet (25 mg total) by mouth every 8 (eight) hours as needed. 09/28/21     hydrOXYzine (ATARAX/VISTARIL) 25 MG tablet Take 1 tablet by mouth daily. 10/18/18   [provider]  lisdexamfetamine (VYVANSE) 10 MG capsule Take 10 mg by mouth daily. Patient not taking: Reported on 05/27/2021    [provider]  loratadine (CLARITIN) 10 MG tablet Take 10 mg by mouth daily.    [provider]  medroxyPROGESTERone (PROVERA) 2.5 MG tablet Take 1 tablet (2.5 mg total) by mouth daily. 09/02/22     Melatonin 10 MG TABS Take 1 tablet by mouth daily.    [provider]  methylphenidate (RITALIN) 10 MG tablet Take 1 tablet (10 mg total) by mouth 2 (two) times daily on an empty stomach 07/20/22     ondansetron (ZOFRAN-ODT) 4 MG disintegrating tablet Take 1 tablet (4 mg total) by mouth every 8 (eight) hours as needed. 11/14/22   [name], PA-C  polyethylene glycol-electrolytes (NULYTELY) 420 g solution use as directed 09/13/22     predniSONE (DELTASONE) 20 MG tablet Take 2 tablets (40 mg total) by mouth daily with breakfast. 11/14/22   [name], PA-C  triamcinolone ointment (KENALOG) 0.5 % Apply 1 application topically 2 (two) times daily as needed. 05/27/21   Rice, Jamesetta Orleans, MD    Family History Family History  Problem Relation Age of Onset   Hyperlipidemia Mother    Skin cancer Mother    Colon cancer Father    Non-Hodgkin's lymphoma Father     Social History Social History   Tobacco Use   Smoking status: Former    Current packs/day: 0.00    Average packs/day: 0.1 packs/day for 5.0 years (0.5 ttl pk-yrs)    Types: Cigarettes    Start date: 2008    Quit date: 2013    Years since quitting: 12.0    Passive exposure: Never   Smokeless tobacco: Never   Tobacco comments:    1 cig daily  Vaping Use   Vaping status: Never Used  Substance Use Topics   Alcohol use: Yes    Comment: Socially   Drug use: Never     Allergies   Sulfa antibiotics and Sulfamethoxazole   Review of Systems Review of Systems  Constitutional:  Positive for activity change and fatigue. Negative for appetite change and fever.  HENT:  Positive for congestion and sore throat. Negative for sinus pressure and sneezing.   Respiratory:  Positive for cough and chest tightness. Negative for shortness of breath and wheezing.   Cardiovascular:  Negative for chest  pain.  Gastrointestinal:  Negative for abdominal pain, diarrhea, nausea and vomiting.  Neurological:  Positive for headaches. Negative for dizziness and light-headedness.     Physical Exam Triage Vital Signs ED Triage Vitals  Encounter Vitals Group     BP 02/07/23 0936 101/69     Systolic BP Percentile --      Diastolic BP Percentile --      Pulse Rate 02/07/23 0936 76     Resp 02/07/23 0936 20     Temp 02/07/23 0936 98.3 F (36.8 C)     Temp  Source 02/07/23 0936 Oral     SpO2 02/07/23 0936 98 %     Weight 02/07/23 0938 136 lb (61.7 kg)     Height --      Head Circumference --      Peak Flow --      Pain Score 02/07/23 0938 4     Pain Loc --      Pain Education --      Exclude from Growth Chart --    No data found.  Updated Vital Signs BP 101/69 (BP Location: Right Arm)   Pulse 76   Temp 98.3 F (36.8 C) (Oral)   Resp 20   Wt 136 lb (61.7 kg)   SpO2 98%   BMI 25.70 kg/m   Visual Acuity Right Eye Distance:   Left Eye Distance:   Bilateral Distance:    Right Eye Near:   Left Eye Near:    Bilateral Near:     Physical Exam Vitals reviewed.  Constitutional:      General: She is awake. She is not in acute distress.    Appearance: Normal appearance. She is well-developed. She is not ill-appearing.     Comments: Very pleasant female presented age in no acute distress sitting comfortably in exam room  HENT:     Head: Normocephalic and atraumatic.     Right Ear: Tympanic membrane, ear canal and external ear normal. Tympanic membrane is not erythematous or bulging.     Left Ear: Tympanic membrane, ear canal and external ear normal. Tympanic membrane is not erythematous or bulging.     Nose:     Right Sinus: No maxillary sinus tenderness or frontal sinus tenderness.     Left Sinus: No maxillary sinus tenderness or frontal sinus tenderness.     Mouth/Throat:     Pharynx: Uvula midline. Postnasal drip present. No oropharyngeal exudate or posterior oropharyngeal  erythema.  Cardiovascular:     Rate and Rhythm: Normal rate and regular rhythm.     Heart sounds: Normal heart sounds, S1 normal and S2 normal. No murmur heard. Pulmonary:     Effort: Pulmonary effort is normal.     Breath sounds: Normal breath sounds. No wheezing, rhonchi or rales.     Comments: Clear to auscultation bilaterally Psychiatric:        Behavior: Behavior is cooperative.      UC Treatments / Results  Labs (all labs ordered are listed, but only abnormal results are displayed) Labs Reviewed  POC COVID19/FLU A&B COMBO - Normal    EKG   Radiology No results found.  Procedures Procedures (including critical care time)  Medications Ordered in UC Medications - No data to display  Initial Impression / Assessment and Plan / UC Course  I have reviewed the triage vital signs and the nursing notes.  Pertinent labs & imaging results that were available during my care of the patient were reviewed by me and considered in my medical decision making (see chart for details).     Patient is well-appearing, afebrile, nontoxic, nontachycardic.  No evidence of acute infection on physical exam though would warrant initiation of antibiotics.  COVID and flu testing were negative.  We discussed likely viral etiology of symptoms.  Will treat symptomatically and she was given Tessalon to help with cough during the day and Promethazine DM for nocturnal cough.  We discussed that Promethazine DM can be sedating and she is not to drive or drink alcohol with taking it.  She does have  a history of reactive airway disease triggered by illness and so refill of albuterol was sent to pharmacy to be used as needed.  Recommended over-the-counter medication including Mucinex, Flonase, Tylenol, nasal saline/sinus rinses.  She is to rest and drink plenty of fluid.  If her symptoms are not improving within a week or if anything worsen she is to be seen immediately.  Strict return precautions given.  Work  excuse note provided.  Final Clinical Impressions(s) / UC Diagnoses   Final diagnoses:  Viral URI with cough     Discharge Instructions      You tested negative for flu and COVID but I suspect you have a different virus.  Use Tessalon for cough during the day and Promethazine DM for cough at night.  This will make you sleepy so do not drive drink alcohol while taking it.  Use albuterol every 4-6 hours as needed.  Use over-the-counter medications including Mucinex, Flonase, Tylenol for symptom relief.  Make sure you rest and drink plenty of fluid.  If you have any worsening symptoms including shortness of breath, chest pain, fever not responding to medication, weakness, nausea/vomiting interfere with oral intake you need to be seen immediately.  If symptoms are not improving by next week return here or see your PCP.      ED Prescriptions     Medication Sig Dispense Auth. Provider   albuterol (VENTOLIN HFA) 108 (90 Base) MCG/ACT inhaler Inhale 1-2 puffs into the lungs every 6 (six) hours as needed. 6.7 g Jemmie Rhinehart K, PA-C   benzonatate (TESSALON) 100 MG capsule Take 1 capsule (100 mg total) by mouth every 8 (eight) hours. 21 capsule Adely Facer K, PA-C   promethazine-dextromethorphan (PROMETHAZINE-DM) 6.25-15 MG/5ML syrup Take 5 mLs by mouth 2 (two) times daily as needed for cough. 118 mL Saanvi Hakala K, PA-C      PDMP not reviewed this encounter.   Jeani Hawking, PA-C 02/07/23 1018

## 2023-02-08 ENCOUNTER — Other Ambulatory Visit (HOSPITAL_COMMUNITY): Payer: Self-pay

## 2023-02-08 ENCOUNTER — Other Ambulatory Visit (HOSPITAL_BASED_OUTPATIENT_CLINIC_OR_DEPARTMENT_OTHER): Payer: Self-pay

## 2023-02-08 ENCOUNTER — Encounter (HOSPITAL_BASED_OUTPATIENT_CLINIC_OR_DEPARTMENT_OTHER): Payer: Self-pay | Admitting: Emergency Medicine

## 2023-02-08 ENCOUNTER — Ambulatory Visit (HOSPITAL_BASED_OUTPATIENT_CLINIC_OR_DEPARTMENT_OTHER)
Admission: EM | Admit: 2023-02-08 | Discharge: 2023-02-08 | Disposition: A | Discharge status: Home / Self Care | Payer: 59 | Attending: Family Medicine | Admitting: Family Medicine

## 2023-02-08 DIAGNOSIS — R051 Acute cough: Secondary | ICD-10-CM | POA: Diagnosis not present

## 2023-02-08 DIAGNOSIS — R059 Cough, unspecified: Secondary | ICD-10-CM | POA: Diagnosis present

## 2023-02-08 DIAGNOSIS — J069 Acute upper respiratory infection, unspecified: Secondary | ICD-10-CM | POA: Diagnosis not present

## 2023-02-08 DIAGNOSIS — R509 Fever, unspecified: Secondary | ICD-10-CM

## 2023-02-08 DIAGNOSIS — Z20828 Contact with and (suspected) exposure to other viral communicable diseases: Secondary | ICD-10-CM | POA: Diagnosis present

## 2023-02-08 LAB — RESPIRATORY PANEL BY PCR

## 2023-02-08 LAB — POCT INFLUENZA A/B
Influenza A, POC: NEGATIVE
Influenza B, POC: NEGATIVE

## 2023-02-08 MED ORDER — HYDROCOD POLI-CHLORPHE POLI ER 10-8 MG/5ML PO SUER: 5.0000 mL | Freq: Two times a day (BID) | ORAL | 0 refills | Status: DC | PRN

## 2023-02-08 MED ORDER — HYDROCOD POLI-CHLORPHE POLI ER 10-8 MG/5ML PO SUER
5.0000 mL | Freq: Two times a day (BID) | ORAL | 0 refills | Status: AC | PRN
  Filled 2023-02-08: qty 120, 12d supply, fill #0

## 2023-02-08 NOTE — ED Triage Notes (Signed)
Pt c/o fevers, cough, congestion, body aches that started yesterday. Was seen yesterday but test was negative for the flu and covid. Was prescribed cough medication and inhaler. Reports feeling worse today so came back to be seen and would like Tamiflu.   Pt reports that her son's girlfriend tested positive for flu yesterday.

## 2023-02-08 NOTE — Discharge Instructions (Signed)
Flu is negative again today.  Respiratory panel sent.  Will adjust her plan of care once the respiratory panel results tomorrow.  If it is positive for influenza type A or B, she requests Tamiflu be sent in.  Get plenty of fluids.  Get plenty of rest.  Feels like Promethazine DM did not work.  Will try Tussionex cough syrup.  Use the inhaler as previously directed for any wheezing.  Use acetaminophen ibuprofen, every 4 hours, as needed for fever or bodyaches.

## 2023-02-08 NOTE — ED Provider Notes (Signed)
[facility]    CSN: [id number] Arrival date & time: 02/08/23  0836      History   Chief Complaint No chief complaint on file.   HPI Rhonda Hurst is a 43 y.o. female.   Reporting fevers (especially during the night, last night), cough, congestion, body aches that started yesterday. Was seen yesterday but test was negative for the flu and covid. Was prescribed cough medication and inhaler. Reports feeling worse today so came back to be seen and would like Tamiflu if she has the flu.   Pt reports that her son's girlfriend tested positive for flu yesterday.       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      Home Medications    Prior to Admission medications   Medication Sig Start Date End Date Taking? Authorizing Provider  chlorpheniramine-HYDROcodone (TUSSIONEX) 10-8 MG/5ML Take 5 mLs by mouth every 12 (twelve) hours as needed for cough. 02/08/23  Yes Prescilla Sours, FNP  albuterol (VENTOLIN HFA) 108 (90 Base) MCG/ACT inhaler Inhale 1-2 puffs into the lungs every 6 (six) hours as needed. 02/07/23   Raspet, Noberto Retort, PA-C  ALPRAZolam (XANAX) 0.25 MG tablet Take 1 tablet (0.25 mg total) by mouth 2 (two) times daily as needed for anxiety. 11/07/22     benzonatate (TESSALON) 100 MG capsule Take 1 capsule (100 mg total) by mouth every 8 (eight) hours. 02/07/23   Raspet, Noberto Retort, PA-C  bisacodyl (DULCOLAX) 5 MG EC tablet Take by mouth as directed. 09/13/22     busPIRone (BUSPAR) 15 MG tablet Take 15 mg by mouth 2 (two) times daily. 12/24/19   [provider]  estradiol (ESTRACE) 1 MG tablet Take 1 tablet (1 mg total) by mouth daily. 09/02/22     fluticasone (FLONASE) 50 MCG/ACT nasal spray Place 2 sprays into both nostrils daily. 11/18/22   Viviano Simas, FNP  hydrOXYzine (ATARAX) 25 MG tablet Take 1 tablet (25 mg total) by mouth every 8 (eight) hours as needed. 09/28/21     loratadine (CLARITIN) 10 MG tablet Take 10 mg by mouth daily.    [provider]  medroxyPROGESTERone (PROVERA) 2.5 MG tablet Take 1 tablet (2.5 mg total) by mouth daily. 09/02/22     Melatonin 10 MG TABS Take 1 tablet by mouth daily.    [provider]  methylphenidate (RITALIN) 10 MG tablet Take 1 tablet (10 mg total) by mouth 2 (two) times daily on an empty stomach 07/20/22     ondansetron (ZOFRAN-ODT) 4 MG disintegrating tablet Take 1 tablet (4 mg total) by mouth every 8 (eight) hours as needed. 11/14/22   [name], PA-C  polyethylene glycol-electrolytes (NULYTELY) 420 g solution use as directed 09/13/22     triamcinolone ointment (KENALOG) 0.5 % Apply 1 application topically 2 (two) times daily as needed. 05/27/21   Fuller Plan, MD    Family History Family History  Problem Relation Age of Onset  Hyperlipidemia Mother    Skin cancer Mother    Colon cancer Father    Non-Hodgkin's lymphoma Father     Social History Social History   Tobacco Use   Smoking status: Former    Current packs/day: 0.00    Average packs/day: 0.1 packs/day for 5.0 years (0.5 ttl pk-yrs)    Types: Cigarettes    Start date: 2008    Quit date: 2013    Years since quitting: 12.0    Passive exposure: Never   Smokeless tobacco: Never   Tobacco comments:    1 cig daily  Vaping Use   Vaping status: Never Used  Substance Use Topics   Alcohol use: Yes    Comment: Socially   Drug use: Never     Allergies   Sulfa antibiotics and Sulfamethoxazole   Review of Systems Review of Systems  Constitutional:  Positive for fever. Negative for chills.   HENT:  Positive for congestion. Negative for ear pain and sore throat.   Eyes:  Negative for pain and visual disturbance.  Respiratory:  Positive for cough. Negative for shortness of breath.   Cardiovascular:  Negative for chest pain and palpitations.  Gastrointestinal:  Negative for abdominal pain, constipation, diarrhea, nausea and vomiting.  Genitourinary:  Negative for dysuria and hematuria.  Musculoskeletal:  Positive for arthralgias. Negative for back pain.  Skin:  Negative for color change and rash.  Neurological:  Negative for seizures and syncope.  All other systems reviewed and are negative.    Physical Exam Triage Vital Signs ED Triage Vitals  Encounter Vitals Group     BP 02/08/23 0930 111/75     Systolic BP Percentile --      Diastolic BP Percentile --      Pulse Rate 02/08/23 0930 (!) 111     Resp 02/08/23 0930 17     Temp 02/08/23 0930 98.6 F (37 C)     Temp Source 02/08/23 0930 Oral     SpO2 02/08/23 0930 98 %     Weight --      Height --      Head Circumference --      Peak Flow --      Pain Score 02/08/23 0929 6     Pain Loc --      Pain Education --      Exclude from Growth Chart --    No data found.  Updated Vital Signs BP 111/75 (BP Location: Right Arm)   Pulse (!) 111   Temp 98.6 F (37 C) (Oral)   Resp 17   SpO2 98%   Visual Acuity Right Eye Distance:   Left Eye Distance:   Bilateral Distance:    Right Eye Near:   Left Eye Near:    Bilateral Near:     Physical Exam Vitals and nursing note reviewed.  Constitutional:      General: She is not in acute distress.    Appearance: She is well-developed. She is not toxic-appearing.  HENT:     Head: Normocephalic and atraumatic.     Right Ear: Hearing, tympanic membrane, ear canal and external ear normal.     Left Ear: Hearing, tympanic membrane, ear canal and external ear normal.     Nose: Mucosal edema, congestion and rhinorrhea present. Rhinorrhea is clear.     Right Sinus: No  maxillary sinus tenderness or frontal sinus tenderness.     Left Sinus: No maxillary sinus tenderness or frontal sinus tenderness.  Mouth/Throat:     Lips: Pink.     Mouth: Mucous membranes are moist.     Pharynx: Uvula midline. No oropharyngeal exudate or posterior oropharyngeal erythema.     Tonsils: No tonsillar exudate.  Eyes:     Conjunctiva/sclera: Conjunctivae normal.     Pupils: Pupils are equal, round, and reactive to light.  Cardiovascular:     Rate and Rhythm: Normal rate and regular rhythm.     Heart sounds: S1 normal and S2 normal. No murmur heard. Pulmonary:     Effort: Pulmonary effort is normal. No respiratory distress.     Breath sounds: No decreased breath sounds, wheezing, rhonchi or rales.  Abdominal:     Palpations: Abdomen is soft.     Tenderness: There is no abdominal tenderness.  Musculoskeletal:        General: No swelling.     Cervical back: Neck supple.  Lymphadenopathy:     Head:     Right side of head: No submental, submandibular, tonsillar, preauricular or posterior auricular adenopathy.     Left side of head: No submental, submandibular, tonsillar, preauricular or posterior auricular adenopathy.     Cervical: No cervical adenopathy.     Right cervical: No superficial cervical adenopathy.    Left cervical: No superficial cervical adenopathy.  Skin:    General: Skin is warm and dry.     Capillary Refill: Capillary refill takes less than 2 seconds.     Findings: No rash.  Neurological:     Mental Status: She is alert and oriented to person, place, and time.  Psychiatric:        Mood and Affect: Mood normal.      UC Treatments / Results  Labs (all labs ordered are listed, but only abnormal results are displayed) Labs Reviewed  POCT INFLUENZA A/B - Normal  RESPIRATORY PANEL BY PCR    EKG   Radiology No results found.  Procedures Procedures (including critical care time)  Medications Ordered in UC Medications - No data to  display  Initial Impression / Assessment and Plan / UC Course  I have reviewed the triage vital signs and the nursing notes.  Pertinent labs & imaging results that were available during my care of the patient were reviewed by me and considered in my medical decision making (see chart for details).  Negative for influenza type A and B.  Respiratory panel sent.  Will adjust her plan of care, as needed once the respiratory panel results.  If positive for influenza A or B, she request Tamiflu.  Get plenty of fluids and rest.  Discontinue Promethazine DM.  Will try Tussionex for cough.  See directions for Tussionex with the prescriptions below.  Provided a work excuse.  Follow-up if symptoms do not improve, worsen or new symptoms occur. Final Clinical Impressions(s) / UC Diagnoses   Final diagnoses:  Fever, unspecified  Acute cough  Viral URI with cough     Discharge Instructions      Flu is negative again today.  Respiratory panel sent.  Will adjust her plan of care once the respiratory panel results tomorrow.  If it is positive for influenza type A or B, she requests Tamiflu be sent in.  Get plenty of fluids.  Get plenty of rest.  Feels like Promethazine DM did not work.  Will try Tussionex cough syrup.  Use the inhaler as previously directed for any wheezing.  Use acetaminophen ibuprofen, every 4 hours, as needed for fever or bodyaches.  ED Prescriptions     Medication Sig Dispense Auth. Provider   chlorpheniramine-HYDROcodone (TUSSIONEX) 10-8 MG/5ML Take 5 mLs by mouth every 12 (twelve) hours as needed for cough. 120 mL Prescilla Sours, FNP      I have reviewed the PDMP during this encounter.   Prescilla Sours, FNP 02/08/23 1114

## 2023-02-09 ENCOUNTER — Other Ambulatory Visit (HOSPITAL_BASED_OUTPATIENT_CLINIC_OR_DEPARTMENT_OTHER): Payer: Self-pay

## 2023-02-09 ENCOUNTER — Telehealth (HOSPITAL_COMMUNITY): Payer: Self-pay

## 2023-02-09 MED ORDER — OSELTAMIVIR PHOSPHATE 75 MG PO CAPS
75.0000 mg | ORAL_CAPSULE | Freq: Two times a day (BID) | ORAL | 0 refills | Status: AC
  Filled 2023-02-09: qty 10, 5d supply, fill #0

## 2023-02-09 NOTE — Telephone Encounter (Signed)
Per provider note, "If positive for influenza A or B, she request Tamiflu." Reviewed with patient, verified pharmacy, prescription sent.

## 2023-03-05 ENCOUNTER — Emergency Department (HOSPITAL_BASED_OUTPATIENT_CLINIC_OR_DEPARTMENT_OTHER): Payer: 59

## 2023-03-05 ENCOUNTER — Emergency Department (HOSPITAL_BASED_OUTPATIENT_CLINIC_OR_DEPARTMENT_OTHER)
Admission: EM | Admit: 2023-03-05 | Discharge: 2023-03-05 | Disposition: A | Discharge status: Home / Self Care | Payer: 59 | Attending: Emergency Medicine | Admitting: Emergency Medicine

## 2023-03-05 ENCOUNTER — Other Ambulatory Visit: Payer: Self-pay

## 2023-03-05 DIAGNOSIS — G44209 Tension-type headache, unspecified, not intractable: Secondary | ICD-10-CM | POA: Diagnosis not present

## 2023-03-05 DIAGNOSIS — M50222 Other cervical disc displacement at C5-C6 level: Secondary | ICD-10-CM | POA: Diagnosis not present

## 2023-03-05 DIAGNOSIS — M5030 Other cervical disc degeneration, unspecified cervical region: Secondary | ICD-10-CM | POA: Insufficient documentation

## 2023-03-05 DIAGNOSIS — M542 Cervicalgia: Secondary | ICD-10-CM

## 2023-03-05 DIAGNOSIS — M436 Torticollis: Secondary | ICD-10-CM | POA: Diagnosis not present

## 2023-03-05 DIAGNOSIS — M50322 Other cervical disc degeneration at C5-C6 level: Secondary | ICD-10-CM | POA: Diagnosis not present

## 2023-03-05 DIAGNOSIS — R519 Headache, unspecified: Secondary | ICD-10-CM

## 2023-03-05 LAB — RESP PANEL BY RT-PCR (RSV, FLU A&B, COVID)  RVPGX2
Influenza A by PCR: NEGATIVE
Influenza B by PCR: NEGATIVE
Resp Syncytial Virus by PCR: NEGATIVE
SARS Coronavirus 2 by RT PCR: NEGATIVE

## 2023-03-05 MED ORDER — ACETAMINOPHEN 500 MG PO TABS
1000.0000 mg | ORAL_TABLET | Freq: Once | ORAL | Status: AC
  Administered 2023-03-05: 1000 mg via ORAL
  Filled 2023-03-05: qty 2

## 2023-03-05 MED ORDER — TRAMADOL HCL 50 MG PO TABS
50.0000 mg | ORAL_TABLET | Freq: Once | ORAL | Status: AC
  Administered 2023-03-05: 50 mg via ORAL
  Filled 2023-03-05: qty 1

## 2023-03-05 MED ORDER — ONDANSETRON 4 MG PO TBDP
8.0000 mg | ORAL_TABLET | Freq: Once | ORAL | Status: AC
  Administered 2023-03-05: 8 mg via ORAL
  Filled 2023-03-05: qty 2

## 2023-03-05 MED ORDER — METHOCARBAMOL 500 MG PO TABS
750.0000 mg | ORAL_TABLET | Freq: Once | ORAL | Status: AC
  Administered 2023-03-05: 750 mg via ORAL
  Filled 2023-03-05: qty 2

## 2023-03-05 MED ORDER — TRAMADOL HCL 50 MG PO TABS: 50.0000 mg | ORAL_TABLET | Freq: Three times a day (TID) | ORAL | 0 refills | Status: DC | PRN

## 2023-03-05 MED ORDER — METHOCARBAMOL 750 MG PO TABS: 750.0000 mg | ORAL_TABLET | Freq: Three times a day (TID) | ORAL | 0 refills | Status: AC | PRN

## 2023-03-05 NOTE — ED Notes (Signed)
 trial 100% O2 NRB.  Minimal change in headache.  Placed back on room air.

## 2023-03-05 NOTE — ED Triage Notes (Signed)
 Pt reports neck pain and stiffness for a few days. Reports that she also had the flu 3 weeks ago and reports that pain in the neck has gotten worse since. Reports that she had a temp of 99.6 at home today and she has vomited x 2 today.

## 2023-03-05 NOTE — ED Provider Notes (Signed)
 [facility] Provider Note   CSN: [id number] Arrival date & time: 03/05/23  5784     History  Chief Complaint  Patient presents with   Torticollis   Neck Pain    Rhonda Hurst is a 43 y.o. female.  Patient c/o neck pain in past week, diffuse/bilateral/posterior, worse w certain movements of head/neck. Pain radiates outwards towards shoulders. No pain down arms. No associated numbness/weakness.  This AM, also developed acute headache, posterior/general. Did have  two episodes nausea/vomiting, not bloody or bilious. No abd pain or distension. Occasional non prod cough. Some body aches. No sinus pain or purulent drainage. No sore throat. Denies any recent neck/head trauma or strain. Took ibuprofen this AM.  The history is provided by the patient, medical records and the spouse.  Neck Pain Associated symptoms: headaches   Associated symptoms: no chest pain, no fever, no numbness and no weakness        Home Medications Prior to Admission medications   Medication Sig Start Date End Date Taking? Authorizing Provider  albuterol (VENTOLIN HFA) 108 (90 Base) MCG/ACT inhaler Inhale 1-2 puffs into the lungs every 6 (six) hours as needed. 02/07/23   Raspet, Noberto Retort, PA-C  ALPRAZolam (XANAX) 0.25 MG tablet Take 1 tablet (0.25 mg total) by mouth 2 (two) times daily as needed for anxiety. 11/07/22     benzonatate (TESSALON) 100 MG capsule Take 1 capsule (100 mg total) by mouth every 8 (eight) hours. 02/07/23   Raspet, Noberto Retort, PA-C  bisacodyl (DULCOLAX) 5 MG EC tablet Take by mouth as directed. 09/13/22     busPIRone (BUSPAR) 15 MG tablet Take 15 mg by mouth 2 (two) times daily. 12/24/19   [provider]  chlorpheniramine-HYDROcodone (TUSSIONEX) 10-8 MG/5ML Take 5 mLs by mouth every 12 (twelve) hours as needed for cough. 02/08/23   Prescilla Sours, FNP  chlorpheniramine-HYDROcodone (TUSSIONEX) 10-8 MG/5ML Take 5 mLs by mouth every 12 (twelve) hours as  needed for cough 02/08/23     estradiol (ESTRACE) 1 MG tablet Take 1 tablet (1 mg total) by mouth daily. 09/02/22     fluticasone (FLONASE) 50 MCG/ACT nasal spray Place 2 sprays into both nostrils daily. 11/18/22   Viviano Simas, FNP  hydrOXYzine (ATARAX) 25 MG tablet Take 1 tablet (25 mg total) by mouth every 8 (eight) hours as needed. 09/28/21     loratadine (CLARITIN) 10 MG tablet Take 10 mg by mouth daily.    [provider]  medroxyPROGESTERone (PROVERA) 2.5 MG tablet Take 1 tablet (2.5 mg total) by mouth daily. 09/02/22     Melatonin 10 MG TABS Take 1 tablet by mouth daily.    [provider]  methylphenidate (RITALIN) 10 MG tablet Take 1 tablet (10 mg total) by mouth 2 (two) times daily on an empty stomach 07/20/22     ondansetron (ZOFRAN-ODT) 4 MG disintegrating tablet Take 1 tablet (4 mg total) by mouth every 8 (eight) hours as needed. 11/14/22   [name], PA-C  polyethylene glycol-electrolytes (NULYTELY) 420 g solution use as directed 09/13/22     triamcinolone ointment (KENALOG) 0.5 % Apply 1 application topically 2 (two) times daily as needed. 05/27/21   Rice, Jamesetta Orleans, MD      Allergies    Sulfa antibiotics and Sulfamethoxazole    Review of Systems   Review of Systems  Constitutional:  Negative for chills and fever.  HENT:  Negative for sinus pain and sore throat.   Eyes:  Negative for pain, redness and visual disturbance.  Respiratory:  Negative for shortness of breath.   Cardiovascular:  Negative for chest pain.  Gastrointestinal:  Positive for nausea and vomiting. Negative for abdominal pain.  Musculoskeletal:  Positive for myalgias and neck pain.  Skin:  Negative for rash.  Neurological:  Positive for headaches. Negative for syncope, speech difficulty, weakness and numbness.    Physical Exam Updated Vital Signs BP 119/75 (BP Location: Left Arm)   Pulse 97   Temp 98.2 F (36.8 C) (Oral)   Resp 14   Ht 1.549 m (5\' 1" )   Wt 59.9 kg   SpO2  98%   BMI 24.94 kg/m  Physical Exam Vitals and nursing note reviewed.  Constitutional:      Appearance: Normal appearance. She is well-developed.  HENT:     Head: Atraumatic.     Comments: Cervical and bil trapezius tenderness, tenderness to posterior scalp. No sts, no skin lesions.    Right Ear: Tympanic membrane normal.     Left Ear: Tympanic membrane normal.     Ears:     Comments: No mastoid tenderness.     Nose: Nose normal.     Mouth/Throat:     Mouth: Mucous membranes are moist.     Pharynx: Oropharynx is clear.  Eyes:     General: No scleral icterus.    Conjunctiva/sclera: Conjunctivae normal.     Pupils: Pupils are equal, round, and reactive to light.  Neck:     Trachea: No tracheal deviation.     Comments: No rigidity or meningeal signs.  Cardiovascular:     Rate and Rhythm: Normal rate and regular rhythm.     Pulses: Normal pulses.     Heart sounds: Normal heart sounds. No murmur heard.    No friction rub. No gallop.  Pulmonary:     Effort: Pulmonary effort is normal. No respiratory distress.     Breath sounds: Normal breath sounds.  Abdominal:     General: Bowel sounds are normal. There is no distension.     Palpations: Abdomen is soft.     Tenderness: There is no abdominal tenderness.  Musculoskeletal:        General: No swelling.     Cervical back: Normal range of motion and neck supple. No rigidity. No muscular tenderness.  Lymphadenopathy:     Cervical: No cervical adenopathy.  Skin:    General: Skin is warm and dry.     Findings: No rash.  Neurological:     Mental Status: She is alert.     Comments: Alert, speech normal. Motor/sens grossly intact bil. Steady gait.      ED Results / Procedures / Treatments   Labs (all labs ordered are listed, but only abnormal results are displayed) Labs Reviewed  RESP PANEL BY RT-PCR (RSV, FLU A&B, COVID)  [id number]    EKG None  Radiology CT Cervical Spine Wo Contrast Result Date: 03/05/2023 CLINICAL DATA:   43 year old female with neck pain and stiffness, increasing headache. Status post influenza 3 weeks ago. EXAM: CT CERVICAL SPINE WITHOUT CONTRAST TECHNIQUE: Multidetector CT imaging of the cervical spine was performed without intravenous contrast. Multiplanar CT image reconstructions were also generated. RADIATION DOSE REDUCTION: This exam was performed according to the departmental dose-optimization program which includes automated exposure control, adjustment of the mA and/or kV according to patient size and/or use of iterative reconstruction technique. COMPARISON:  Head CT today. FINDINGS: Alignment: Straightening of cervical lordosis. Bilateral posterior element alignment is  within normal limits. Skull base and vertebrae: Bone mineralization is within normal limits. Visualized skull base is intact. No atlanto-occipital dissociation. C1 and C2 appear intact and aligned. Minimal cervical vertebral endplate spurring. No acute osseous abnormality identified. Soft tissues and spinal canal: No prevertebral fluid or swelling. No visible canal hematoma. Negative visible noncontrast neck soft tissues. Disc levels: Isolated mild to moderate disc bulging and endplate spurring at C5-C6. Upper chest: Negative. IMPRESSION: Negative CT appearance of the cervical spine except for mild to moderate C5-C6 disc and endplate degeneration. Electronically Signed   By: Odessa Fleming M.D.   On: 03/05/2023 10:30   CT Head Wo Contrast Result Date: 03/05/2023 CLINICAL DATA:  43 year old female with neck pain and stiffness, increasing headache. Status post influenza 3 weeks ago. EXAM: CT HEAD WITHOUT CONTRAST TECHNIQUE: Contiguous axial images were obtained from the base of the skull through the vertex without intravenous contrast. RADIATION DOSE REDUCTION: This exam was performed according to the departmental dose-optimization program which includes automated exposure control, adjustment of the mA and/or kV according to patient size and/or  use of iterative reconstruction technique. COMPARISON:  Brain MRI 03/17/2011.  Head CT 06/16/2015. FINDINGS: Brain: Normal cerebral volume. No midline shift, ventriculomegaly, mass effect, evidence of mass lesion, intracranial hemorrhage or evidence of cortically based acute infarction. Gray-white matter differentiation is within normal limits throughout the brain. Normal basilar cisterns. Vascular: No suspicious intracranial vascular hyperdensity. Skull: Intact, negative. Sinuses/Orbits: Previous maxillary antrostomies. Visualized paranasal sinuses and mastoids are clear. Other: Visualized orbits and scalp soft tissues are within normal limits. IMPRESSION: Normal noncontrast Head CT. Electronically Signed   By: Odessa Fleming M.D.   On: 03/05/2023 10:27    Procedures Procedures    Medications Ordered in ED Medications  acetaminophen (TYLENOL) tablet 1,000 mg (has no administration in time range)  methocarbamol (ROBAXIN) tablet 750 mg (750 mg Oral Given 03/05/23 0953)  traMADol (ULTRAM) tablet 50 mg (50 mg Oral Given 03/05/23 0954)  ondansetron (ZOFRAN-ODT) disintegrating tablet 8 mg (8 mg Oral Given 03/05/23 0953)    ED Course/ Medical Decision Making/ A&P                                 Medical Decision Making Problems Addressed: DDD (degenerative disc disease), cervical: chronic illness or injury Muscle tension headache: acute illness or injury Neck pain: acute illness or injury Occipital headache: acute illness or injury  Amount and/or Complexity of Data Reviewed Independent Historian: spouse External Data Reviewed: notes. Radiology: ordered and independent interpretation performed. Decision-making details documented in ED Course.  Risk OTC drugs. Prescription drug management.   Reviewed nursing notes and prior charts for additional history.   Imaging ordered.   Ultram po, robaxin po, zofran po.   CT reviewed/interpreted by me - no fx or acute process.   Po fluids. Acetaminophen  po.   Recheck, hr 92, rr 14, no recurrent emesis. Symptoms improved.   Pt currently appears stable for d/c.   Rec pcp f/u.  Return precautions provided.           Final Clinical Impression(s) / ED Diagnoses Final diagnoses:  None    Rx / DC Orders ED Discharge Orders     None         Cathren Laine, MD 03/05/23 1120

## 2023-03-05 NOTE — ED Notes (Signed)
 Pt has returned to their room

## 2023-03-05 NOTE — ED Notes (Signed)
 Pt given icepack for her neck. Her headache is still just as bad, frontal lobe, non radiating, no neuro deficits.

## 2023-03-05 NOTE — Discharge Instructions (Addendum)
 It was our pleasure to provide your ER care today - we hope that you feel better.  Take acetaminophen or ibuprofen as need for pain. You may also take robaxin as need for muscle pain/spasm and/or ultram as need for pain - no driving when taking robaxin or ultram.   You may also try gentle massage to neck, upper back, and posterior scalp area for symptom relief.    Follow up with primary care doctor in one week if symptoms fail to improve/resolve.  Return to ER if worse, new symptoms, high fevers, new, severe or intractable pain, numbness/weakness, severe headache, persistent vomiting,  or other emergency concern.

## 2023-03-06 ENCOUNTER — Emergency Department (HOSPITAL_COMMUNITY): Admission: EM | Admit: 2023-03-06 | Payer: Self-pay | Source: Home / Self Care

## 2023-03-06 ENCOUNTER — Emergency Department (HOSPITAL_COMMUNITY): Payer: 59

## 2023-03-06 ENCOUNTER — Emergency Department (HOSPITAL_COMMUNITY)
Admission: EM | Admit: 2023-03-06 | Discharge: 2023-03-07 | Disposition: A | Discharge status: Home / Self Care | Payer: 59 | Attending: Emergency Medicine | Admitting: Emergency Medicine

## 2023-03-06 ENCOUNTER — Encounter (HOSPITAL_COMMUNITY): Payer: Self-pay

## 2023-03-06 ENCOUNTER — Other Ambulatory Visit: Payer: Self-pay

## 2023-03-06 DIAGNOSIS — M4802 Spinal stenosis, cervical region: Secondary | ICD-10-CM | POA: Diagnosis not present

## 2023-03-06 DIAGNOSIS — M47812 Spondylosis without myelopathy or radiculopathy, cervical region: Secondary | ICD-10-CM | POA: Diagnosis not present

## 2023-03-06 DIAGNOSIS — R000 Tachycardia, unspecified: Secondary | ICD-10-CM | POA: Diagnosis not present

## 2023-03-06 DIAGNOSIS — R519 Headache, unspecified: Secondary | ICD-10-CM | POA: Insufficient documentation

## 2023-03-06 DIAGNOSIS — M542 Cervicalgia: Secondary | ICD-10-CM | POA: Diagnosis not present

## 2023-03-06 DIAGNOSIS — M50221 Other cervical disc displacement at C4-C5 level: Secondary | ICD-10-CM | POA: Diagnosis not present

## 2023-03-06 DIAGNOSIS — M5021 Other cervical disc displacement,  high cervical region: Secondary | ICD-10-CM | POA: Diagnosis not present

## 2023-03-06 LAB — CBC WITH DIFFERENTIAL/PLATELET
Abs Immature Granulocytes: 0.02 10*3/uL (ref 0.00–0.07)
Basophils Absolute: 0 10*3/uL (ref 0.0–0.1)
Basophils Relative: 0 %
Eosinophils Absolute: 0 10*3/uL (ref 0.0–0.5)
Eosinophils Relative: 1 %
HCT: 42.2 % (ref 36.0–46.0)
Hemoglobin: 15 g/dL (ref 12.0–15.0)
Immature Granulocytes: 0 %
Lymphocytes Relative: 25 %
Lymphs Abs: 2 10*3/uL (ref 0.7–4.0)
MCH: 30.9 pg (ref 26.0–34.0)
MCHC: 35.5 g/dL (ref 30.0–36.0)
MCV: 87 fL (ref 80.0–100.0)
Monocytes Absolute: 0.8 10*3/uL (ref 0.1–1.0)
Monocytes Relative: 10 %
Neutro Abs: 5.1 10*3/uL (ref 1.7–7.7)
Neutrophils Relative %: 64 %
Platelets: 296 10*3/uL (ref 150–400)
RBC: 4.85 MIL/uL (ref 3.87–5.11)
RDW: 11.9 % (ref 11.5–15.5)
WBC: 8 10*3/uL (ref 4.0–10.5)
nRBC: 0 % (ref 0.0–0.2)

## 2023-03-06 LAB — BASIC METABOLIC PANEL
Anion gap: 12 (ref 5–15)
BUN: 8 mg/dL (ref 6–20)
CO2: 25 mmol/L (ref 22–32)
Calcium: 9.4 mg/dL (ref 8.9–10.3)
Chloride: 103 mmol/L (ref 98–111)
Creatinine, Ser: 0.67 mg/dL (ref 0.44–1.00)
GFR, Estimated: >60 mL/min (ref >60)
Glucose, Bld: 108 mg/dL — ABNORMAL HIGH (ref 70–99)
Potassium: 3.7 mmol/L (ref 3.5–5.1)
Sodium: 140 mmol/L (ref 135–145)

## 2023-03-06 LAB — HCG, SERUM, QUALITATIVE: Preg, Serum: NEGATIVE

## 2023-03-06 MED ORDER — LORAZEPAM 1 MG PO TABS
1.0000 mg | ORAL_TABLET | Freq: Once | ORAL | Status: AC
Start: 2023-03-06 — End: 2023-03-06
  Administered 2023-03-06: 1 mg via ORAL
  Filled 2023-03-06: qty 1

## 2023-03-06 MED ORDER — GADOBUTROL 1 MMOL/ML IV SOLN
6.0000 mL | Freq: Once | INTRAVENOUS | Status: AC | PRN
  Administered 2023-03-06: 6 mL via INTRAVENOUS

## 2023-03-06 MED ORDER — ONDANSETRON 4 MG PO TBDP
4.0000 mg | ORAL_TABLET | Freq: Once | ORAL | Status: AC
  Administered 2023-03-06: 4 mg via ORAL
  Filled 2023-03-06: qty 1

## 2023-03-06 NOTE — ED Provider Triage Note (Signed)
 Emergency Medicine Provider Triage Evaluation Note  Shaindy Reader , a 43 y.o. female  was evaluated in triage.  Pt complains of Neck pain for 3 weeks since the flu, headache began yesterday on waking. Nausea with moving around, slight dizziness, no hx of vertigo. Hx of migraines in the past. Seen neurology for neck pain, diagnosed with bulging disc. Went to PT and improved. This has been similar. Usually requires preceding event, this seemed to spontaneously occur. Bent over last Tuesday and stood up and had sharp pain in back of head. Head hurt rest of night and went away.  Review of Systems  Positive:  Negative:   Physical Exam  BP 122/76 (BP Location: Right Arm)   Pulse (!) 108   Temp 98.2 F (36.8 C)   Resp 15   Ht 5\' 1"  (1.549 m)   Wt 59.9 kg   SpO2 98%   BMI 24.95 kg/m  Gen:   Awake, no distress   Resp:  Normal effort  MSK:   Moves extremities without difficulty  Other:    Medical Decision Making  Medically screening exam initiated at 4:06 PM.  Appropriate orders placed.  Megham Dwyer was informed that the remainder of the evaluation will be completed by another provider, this initial triage assessment does not replace that evaluation, and the importance of remaining in the ED until their evaluation is complete.     Al Decant, PA-C 03/06/23 1606

## 2023-03-06 NOTE — ED Triage Notes (Signed)
 Pt c/o neck stiffnessx3wks. Pt states this has been going on since she had the flu, but got worse this week. Pt c/o since yesterday. Pt c/o N/V when she gets up and walks around. Pt denies vision changes, but states her eyes hurt.

## 2023-03-07 ENCOUNTER — Emergency Department (HOSPITAL_COMMUNITY): Payer: 59

## 2023-03-07 DIAGNOSIS — R519 Headache, unspecified: Secondary | ICD-10-CM | POA: Diagnosis not present

## 2023-03-07 MED ORDER — IOHEXOL 350 MG/ML SOLN
75.0000 mL | Freq: Once | INTRAVENOUS | Status: AC | PRN
  Administered 2023-03-07: 75 mL via INTRAVENOUS

## 2023-03-07 MED ORDER — CYCLOBENZAPRINE HCL 10 MG PO TABS
10.0000 mg | ORAL_TABLET | Freq: Once | ORAL | Status: AC
  Administered 2023-03-07: 10 mg via ORAL
  Filled 2023-03-07: qty 1

## 2023-03-07 MED ORDER — CYCLOBENZAPRINE HCL 10 MG PO TABS: 10.0000 mg | ORAL_TABLET | Freq: Two times a day (BID) | ORAL | 0 refills | Status: DC | PRN

## 2023-03-07 MED ORDER — METOCLOPRAMIDE HCL 5 MG/ML IJ SOLN
10.0000 mg | Freq: Once | INTRAMUSCULAR | Status: AC
  Administered 2023-03-07: 10 mg via INTRAVENOUS
  Filled 2023-03-07: qty 2

## 2023-03-07 MED ORDER — KETOROLAC TROMETHAMINE 15 MG/ML IJ SOLN
15.0000 mg | Freq: Once | INTRAMUSCULAR | Status: AC
  Administered 2023-03-07: 15 mg via INTRAVENOUS
  Filled 2023-03-07: qty 1

## 2023-03-07 MED ORDER — DEXAMETHASONE SODIUM PHOSPHATE 10 MG/ML IJ SOLN
10.0000 mg | Freq: Once | INTRAMUSCULAR | Status: AC
  Administered 2023-03-07: 10 mg via INTRAVENOUS
  Filled 2023-03-07: qty 1

## 2023-03-07 MED ORDER — SODIUM CHLORIDE 0.9 % IV BOLUS
500.0000 mL | Freq: Once | INTRAVENOUS | Status: AC
  Administered 2023-03-07: 500 mL via INTRAVENOUS

## 2023-03-07 NOTE — ED Provider Notes (Signed)
 [facility]ERGENCY DEPARTMENT AT [facility] Provider Note   CSN: [id number] Arrival date & time: 03/06/23  1432     History  Chief Complaint  Patient presents with   Headache   Neck Pain    Rhonda Hurst is a 43 y.o. female.  The history is provided by the patient and medical records.  Headache Associated symptoms: neck pain   Neck Pain Associated symptoms: headaches   Rhonda Hurst is a 43 y.o. female who presents to the Emergency Department complaining of headache and neck pain.  She presents to the emergency department for symptoms that initially started about 3 weeks ago when she was getting over the flu.  She started with generalized neck pain.  On Tuesday she stood up from feeding the dog and her pain severely worsened in her neck and radiated up to her head.  Pain waxes and wanes but is worse with movement.  On Sunday she developed a headache that is both occipital and frontal and involves her eyes.  Her headache comes in waves and she also has pulsatile tinnitus in the left ear.  She has been vomiting since Sunday.  Symptoms are significantly worse when she stands up or moves.  No associated fevers, numbness, weakness.  She had a temperature of nine 9.6 on Sunday.  She has a history of anxiety, menopause.  She is on estrogen and progesterone replacement as of 3 to 4 months ago.  No tobacco use.  No reported trauma.        Home Medications Prior to Admission medications   Medication Sig Start Date End Date Taking? Authorizing Provider  cyclobenzaprine (FLEXERIL) 10 MG tablet Take 1 tablet (10 mg total) by mouth 2 (two) times daily as needed for muscle spasms. 03/07/23  Yes Tilden Fossa, MD  albuterol (VENTOLIN HFA) 108 (90 Base) MCG/ACT inhaler Inhale 1-2 puffs into the lungs every 6 (six) hours as needed. 02/07/23   Raspet, Noberto Retort, PA-C  ALPRAZolam (XANAX) 0.25 MG tablet Take 1 tablet (0.25 mg total) by mouth 2 (two) times daily as needed for anxiety. 11/07/22      benzonatate (TESSALON) 100 MG capsule Take 1 capsule (100 mg total) by mouth every 8 (eight) hours. 02/07/23   Raspet, Noberto Retort, PA-C  bisacodyl (DULCOLAX) 5 MG EC tablet Take by mouth as directed. 09/13/22     busPIRone (BUSPAR) 15 MG tablet Take 15 mg by mouth 2 (two) times daily. 12/24/19   [provider]  chlorpheniramine-HYDROcodone (TUSSIONEX) 10-8 MG/5ML Take 5 mLs by mouth every 12 (twelve) hours as needed for cough. 02/08/23   Prescilla Sours, FNP  chlorpheniramine-HYDROcodone (TUSSIONEX) 10-8 MG/5ML Take 5 mLs by mouth every 12 (twelve) hours as needed for cough 02/08/23     estradiol (ESTRACE) 1 MG tablet Take 1 tablet (1 mg total) by mouth daily. 09/02/22     fluticasone (FLONASE) 50 MCG/ACT nasal spray Place 2 sprays into both nostrils daily. 11/18/22   Viviano Simas, FNP  hydrOXYzine (ATARAX) 25 MG tablet Take 1 tablet (25 mg total) by mouth every 8 (eight) hours as needed. 09/28/21     loratadine (CLARITIN) 10 MG tablet Take 10 mg by mouth daily.    [provider]  medroxyPROGESTERone (PROVERA) 2.5 MG tablet Take 1 tablet (2.5 mg total) by mouth daily. 09/02/22     Melatonin 10 MG TABS Take 1 tablet by mouth daily.    [provider]  methocarbamol (ROBAXIN) 750 MG tablet Take 1 tablet (750  mg total) by mouth 3 (three) times daily as needed (muscle spasm/pain). 03/05/23   Cathren Laine, MD  methylphenidate (RITALIN) 10 MG tablet Take 1 tablet (10 mg total) by mouth 2 (two) times daily on an empty stomach 07/20/22     ondansetron (ZOFRAN-ODT) 4 MG disintegrating tablet Take 1 tablet (4 mg total) by mouth every 8 (eight) hours as needed. 11/14/22   [name], PA-C  polyethylene glycol-electrolytes (NULYTELY) 420 g solution use as directed 09/13/22     traMADol (ULTRAM) 50 MG tablet Take 1 tablet (50 mg total) by mouth every 8 (eight) hours as needed. 03/05/23   Cathren Laine, MD  triamcinolone ointment (KENALOG) 0.5 % Apply 1 application topically 2 (two) times  daily as needed. 05/27/21   Rice, Jamesetta Orleans, MD      Allergies    Sulfa antibiotics and Sulfamethoxazole    Review of Systems   Review of Systems  Musculoskeletal:  Positive for neck pain.  Neurological:  Positive for headaches.  All other systems reviewed and are negative.   Physical Exam Updated Vital Signs BP 119/76   Pulse (!) 104   Temp 97.9 F (36.6 C) (Oral)   Resp 18   Ht 5\' 1"  (1.549 m)   Wt 59.9 kg   SpO2 100%   BMI 24.95 kg/m  Physical Exam Vitals and nursing note reviewed.  Constitutional:      Appearance: She is well-developed.  HENT:     Head: Normocephalic and atraumatic.  Cardiovascular:     Rate and Rhythm: Normal rate and regular rhythm.     Heart sounds: No murmur heard. Pulmonary:     Effort: Pulmonary effort is normal. No respiratory distress.     Breath sounds: Normal breath sounds.  Abdominal:     Palpations: Abdomen is soft.     Tenderness: There is no abdominal tenderness. There is no guarding or rebound.  Musculoskeletal:        General: No tenderness.     Cervical back: Neck supple.  Lymphadenopathy:     Cervical: No cervical adenopathy.  Skin:    General: Skin is warm and dry.  Neurological:     Mental Status: She is alert and oriented to person, place, and time.     Comments: No asymmetry of facial movements.  Visual fields grossly intact.  5 out of 5 strength in all 4 extremities with sensation to light touch intact in all 4 extremities  Psychiatric:        Behavior: Behavior normal.     ED Results / Procedures / Treatments   Labs (all labs ordered are listed, but only abnormal results are displayed) Labs Reviewed  BASIC METABOLIC PANEL - Abnormal; Notable for the following components:      Result Value   Glucose, Bld 108 (*)    All other components within normal limits  HCG, SERUM, QUALITATIVE  CBC WITH DIFFERENTIAL/PLATELET    EKG EKG Interpretation Date/Time:  Tuesday March 07 2023 02:41:15 EST Ventricular  Rate:  102 PR Interval:  122 QRS Duration:  85 QT Interval:  356 QTC Calculation: 464 R Axis:   60  Text Interpretation: Sinus tachycardia Consider right atrial enlargement Borderline repol abnormality, diffuse leads Confirmed by Tilden Fossa [phone]) on 03/07/2023 4:16:20 AM  Radiology CT ANGIO HEAD NECK W WO CM Result Date: 03/07/2023 CLINICAL DATA:  Sudden and severe headache EXAM: CT ANGIOGRAPHY HEAD AND NECK WITH AND WITHOUT CONTRAST TECHNIQUE: Multidetector CT imaging of the head and  neck was performed using the standard protocol during bolus administration of intravenous contrast. Multiplanar CT image reconstructions and MIPs were obtained to evaluate the vascular anatomy. Carotid stenosis measurements (when applicable) are obtained utilizing NASCET criteria, using the distal internal carotid diameter as the denominator. RADIATION DOSE REDUCTION: This exam was performed according to the departmental dose-optimization program which includes automated exposure control, adjustment of the mA and/or kV according to patient size and/or use of iterative reconstruction technique. CONTRAST:  75mL OMNIPAQUE IOHEXOL 350 MG/ML SOLN COMPARISON:  Yesterday FINDINGS: CT HEAD FINDINGS Brain: No evidence of acute infarction, hemorrhage, hydrocephalus, extra-axial collection or mass lesion/mass effect. Vascular: See below Skull: Normal. Negative for fracture or focal lesion. Sinuses/Orbits: No acute finding. Review of the MIP images confirms the above findings CTA NECK FINDINGS Aortic arch: Unremarkable Right carotid system: Vessels are smoothly contoured and widely patent without significant atheromatous change. Left carotid system: Vessels are smoothly contoured and widely patent without significant atheromatous change. Vertebral arteries: Vessels are smoothly contoured and widely patent without significant atheromatous change. Skeleton: Negative. Other neck: Negative Upper chest: Clear apical lungs Review of the  MIP images confirms the above findings CTA HEAD FINDINGS Anterior circulation: No significant stenosis, proximal occlusion, aneurysm, or vascular malformation. Posterior circulation: No significant stenosis, proximal occlusion, aneurysm, or vascular malformation. Venous sinuses: As permitted by contrast timing, patent. Anatomic variants: None significant Review of the MIP images confirms the above findings IMPRESSION: Negative CTA.  No explanation for headache. Electronically Signed   By: Tiburcio Pea M.D.   On: 03/07/2023 04:03   MR Cervical Spine Wo Contrast Result Date: 03/06/2023 CLINICAL DATA:  Initial evaluation for acute myelopathy. EXAM: MRI CERVICAL SPINE WITHOUT CONTRAST TECHNIQUE: Multiplanar, multisequence MR imaging of the cervical spine was performed. No intravenous contrast was administered. COMPARISON:  Prior CT from 03/05/2023 and MRI from 09/13/2013. FINDINGS: Alignment: Straightening of the normal cervical lordosis. No listhesis. Vertebrae: Vertebral body height maintained without acute or chronic fracture. Bone marrow signal intensity within normal limits. No worrisome osseous lesions or abnormal marrow edema. Cord: Normal signal and morphology. Posterior Fossa, vertebral arteries, paraspinal tissues: Unremarkable. Disc levels: C2-C3: Unremarkable. C3-C4: Mild disc bulge with superimposed tiny central disc protrusion. No spinal stenosis. Foramina remain patent. C4-C5: Mild disc bulge with bilateral uncovertebral spurring. No significant spinal stenosis. Foramina remain patent. C5-C6: Broad base right paracentral disc protrusion flattens and indents the ventral thecal sac (series 6, image 20). Mild cord flattening without cord signal changes. Mild spinal stenosis. Superimposed uncovertebral spurring with resultant mild left C6 foraminal narrowing. Right neural foramina remains patent. C6-C7: Central disc protrusion with annular fissure indents the ventral thecal sac (series 6, image 25). No  more than mild spinal stenosis. Foramina remain patent. C7-T1: Tiny central disc protrusion minimally indents the ventral thecal sac. Mild left-sided facet hypertrophy. No spinal stenosis. Foramina remain patent. IMPRESSION: 1. Normal MRI appearance of the cervical spinal cord. 2. 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 Electronically Signed   By: Rise Mu M.D.   On: 03/06/2023 19:18   MR Brain W and Wo Contrast Result Date: 03/06/2023 CLINICAL DATA:  Initial evaluation for headache, increasing frequency or severity. EXAM: MRI HEAD WITHOUT AND WITH CONTRAST TECHNIQUE: Multiplanar, multiecho pulse sequences of the brain and surrounding structures were obtained without and with intravenous contrast. CONTRAST:  6mL GADAVIST GADOBUTROL 1 MMOL/ML IV SOLN COMPARISON:  CT from 03/05/2023. FINDINGS: Brain: Cerebral volume within normal limits for  age. No focal parenchymal signal abnormality. No abnormal foci of restricted diffusion to suggest acute or subacute ischemia. Gray-white matter differentiation well maintained. No encephalomalacia to suggest chronic cortical infarction or other insult. Single focus of susceptibility artifact noted involving the right pons, consistent with a small chronic microhemorrhage, of doubtful significance in isolation. No mass lesion, midline shift or mass effect. Ventricles normal in size and morphology without hydrocephalus. No extra-axial fluid collection. Pituitary gland and suprasellar region within normal limits. No abnormal enhancement. Vascular: Major intracranial vascular flow voids are well maintained. Skull and upper cervical spine: Craniocervical junction within normal limits. Visualized upper cervical spine demonstrates no significant finding. Bone marrow signal intensity within normal limits. No scalp soft tissue abnormality. Sinuses/Orbits: Globes and orbital soft tissues are within  normal limits. Paranasal sinuses are largely clear. No significant mastoid effusion. Other: None. IMPRESSION: Normal brain MRI for age. No acute intracranial abnormality or findings to explain patient's symptoms. Electronically Signed   By: Rise Mu M.D.   On: 03/06/2023 19:13   CT Cervical Spine Wo Contrast Result Date: 03/05/2023 CLINICAL DATA:  43 year old female with neck pain and stiffness, increasing headache. Status post influenza 3 weeks ago. EXAM: CT CERVICAL SPINE WITHOUT CONTRAST TECHNIQUE: Multidetector CT imaging of the cervical spine was performed without intravenous contrast. Multiplanar CT image reconstructions were also generated. RADIATION DOSE REDUCTION: This exam was performed according to the departmental dose-optimization program which includes automated exposure control, adjustment of the mA and/or kV according to patient size and/or use of iterative reconstruction technique. COMPARISON:  Head CT today. FINDINGS: Alignment: Straightening of cervical lordosis. Bilateral posterior element alignment is within normal limits. Skull base and vertebrae: Bone mineralization is within normal limits. Visualized skull base is intact. No atlanto-occipital dissociation. C1 and C2 appear intact and aligned. Minimal cervical vertebral endplate spurring. No acute osseous abnormality identified. Soft tissues and spinal canal: No prevertebral fluid or swelling. No visible canal hematoma. Negative visible noncontrast neck soft tissues. Disc levels: Isolated mild to moderate disc bulging and endplate spurring at C5-C6. Upper chest: Negative. IMPRESSION: Negative CT appearance of the cervical spine except for mild to moderate C5-C6 disc and endplate degeneration. Electronically Signed   By: Odessa Fleming M.D.   On: 03/05/2023 10:30   CT Head Wo Contrast Result Date: 03/05/2023 CLINICAL DATA:  43 year old female with neck pain and stiffness, increasing headache. Status post influenza 3 weeks ago. EXAM:  CT HEAD WITHOUT CONTRAST TECHNIQUE: Contiguous axial images were obtained from the base of the skull through the vertex without intravenous contrast. RADIATION DOSE REDUCTION: This exam was performed according to the departmental dose-optimization program which includes automated exposure control, adjustment of the mA and/or kV according to patient size and/or use of iterative reconstruction technique. COMPARISON:  Brain MRI 03/17/2011.  Head CT 06/16/2015. FINDINGS: Brain: Normal cerebral volume. No midline shift, ventriculomegaly, mass effect, evidence of mass lesion, intracranial hemorrhage or evidence of cortically based acute infarction. Gray-white matter differentiation is within normal limits throughout the brain. Normal basilar cisterns. Vascular: No suspicious intracranial vascular hyperdensity. Skull: Intact, negative. Sinuses/Orbits: Previous maxillary antrostomies. Visualized paranasal sinuses and mastoids are clear. Other: Visualized orbits and scalp soft tissues are within normal limits. IMPRESSION: Normal noncontrast Head CT. Electronically Signed   By: Odessa Fleming M.D.   On: 03/05/2023 10:27    Procedures Procedures    Medications Ordered in ED Medications  ketorolac (TORADOL) 15 MG/ML injection 15 mg (has no administration in time range)  dexamethasone (DECADRON) injection 10 mg (  has no administration in time range)  ondansetron (ZOFRAN-ODT) disintegrating tablet 4 mg (4 mg Oral Given 03/06/23 1609)  LORazepam (ATIVAN) tablet 1 mg (1 mg Oral Given 03/06/23 1720)  gadobutrol (GADAVIST) 1 MMOL/ML injection 6 mL (6 mLs Intravenous Contrast Given 03/06/23 1808)  metoCLOPramide (REGLAN) injection 10 mg (10 mg Intravenous Given 03/07/23 0234)  cyclobenzaprine (FLEXERIL) tablet 10 mg (10 mg Oral Given 03/07/23 0234)  sodium chloride 0.9 % bolus 500 mL (500 mLs Intravenous New Bag/Given 03/07/23 0234)  iohexol (OMNIPAQUE) 350 MG/ML injection 75 mL (75 mLs Intravenous Contrast Given 03/07/23 1610)     ED Course/ Medical Decision Making/ A&P Clinical Course as of 03/07/23 0419  Mon Mar 06, 2023  1559 Watch advising her of elevated HR [CG]    Clinical Course User Index [CG] Al Decant, PA-C                                 Medical Decision Making Amount and/or Complexity of Data Reviewed Labs: ordered. Radiology: ordered.  Risk Prescription drug management.   Patient here for evaluation of progressive headache, neck pain.  She is nontoxic-appearing on evaluation with no focal neurologic deficits.  Current clinical picture is not consistent with meningitis, subarachnoid hemorrhage.  Doubt dural sinus thrombosis, MRI is negative for acute abnormality.  Given report of severe pain in setting of bending over a CTA head and neck was obtained, which is negative for dissection.  She does have improvement in her symptoms with medications in the department.  Plan to discharge home with home care for headache, neck pain with PCP follow-up and return precautions.        Final Clinical Impression(s) / ED Diagnoses Final diagnoses:  Neck pain  Bad headache    Rx / DC Orders ED Discharge Orders          Ordered    cyclobenzaprine (FLEXERIL) 10 MG tablet  2 times daily PRN        03/07/23 0417              Tilden Fossa, MD 03/07/23 [phone]

## 2023-03-07 NOTE — ED Notes (Signed)
 Patient transported to CT

## 2023-03-07 NOTE — ED Notes (Addendum)
 Patient back from CT.

## 2023-03-10 ENCOUNTER — Other Ambulatory Visit: Payer: Self-pay

## 2023-03-10 ENCOUNTER — Other Ambulatory Visit (HOSPITAL_COMMUNITY): Payer: Self-pay

## 2023-03-21 ENCOUNTER — Other Ambulatory Visit (HOSPITAL_COMMUNITY): Payer: Self-pay

## 2023-03-21 DIAGNOSIS — M5030 Other cervical disc degeneration, unspecified cervical region: Secondary | ICD-10-CM | POA: Diagnosis not present

## 2023-03-21 DIAGNOSIS — Z6825 Body mass index (BMI) 25.0-25.9, adult: Secondary | ICD-10-CM | POA: Diagnosis not present

## 2023-03-21 DIAGNOSIS — F988 Other specified behavioral and emotional disorders with onset usually occurring in childhood and adolescence: Secondary | ICD-10-CM | POA: Diagnosis not present

## 2023-03-21 DIAGNOSIS — F419 Anxiety disorder, unspecified: Secondary | ICD-10-CM | POA: Diagnosis not present

## 2023-03-21 MED ORDER — METHOCARBAMOL 500 MG PO TABS
500.0000 mg | ORAL_TABLET | Freq: Four times a day (QID) | ORAL | 3 refills | Status: AC | PRN
  Filled 2023-03-21: qty 120, 15d supply, fill #0
  Filled 2023-12-22: qty 120, 15d supply, fill #1

## 2023-03-21 MED ORDER — PREDNISONE 5 MG (21) PO TBPK
ORAL_TABLET | ORAL | 0 refills | Status: AC
  Filled 2023-03-21: qty 21, 6d supply, fill #0

## 2023-03-24 ENCOUNTER — Other Ambulatory Visit (HOSPITAL_COMMUNITY): Payer: Self-pay

## 2023-03-24 ENCOUNTER — Telehealth: Admitting: Family Medicine

## 2023-03-24 DIAGNOSIS — J020 Streptococcal pharyngitis: Secondary | ICD-10-CM

## 2023-03-24 DIAGNOSIS — R509 Fever, unspecified: Secondary | ICD-10-CM

## 2023-03-24 MED ORDER — AMOXICILLIN 875 MG PO TABS
875.0000 mg | ORAL_TABLET | Freq: Two times a day (BID) | ORAL | 0 refills | Status: AC
  Filled 2023-03-24: qty 20, 10d supply, fill #0

## 2023-03-24 NOTE — Progress Notes (Signed)
 Virtual Visit Consent   Rhonda Hurst, you are scheduled for a virtual visit with a [facility] provider today. Just as with appointments in the office, your consent must be obtained to participate. Your consent will be active for this visit and any virtual visit you may have with one of our providers in the next 365 days. If you have a MyChart account, a copy of this consent can be sent to you electronically.  As this is a virtual visit, video technology does not allow for your provider to perform a traditional examination. This may limit your provider's ability to fully assess your condition. If your provider identifies any concerns that need to be evaluated in person or the need to arrange testing (such as labs, EKG, etc.), we will make arrangements to do so. Although advances in technology are sophisticated, we cannot ensure that it will always work on either your end or our end. If the connection with a video visit is poor, the visit may have to be switched to a telephone visit. With either a video or telephone visit, we are not always able to ensure that we have a secure connection.  By engaging in this virtual visit, you consent to the provision of healthcare and authorize for your insurance to be billed (if applicable) for the services provided during this visit. Depending on your insurance coverage, you may receive a charge related to this service.  I need to obtain your verbal consent now. Are you willing to proceed with your visit today? Rhonda Hurst has provided verbal consent on 03/24/2023 for a virtual visit (video or telephone). Rhonda Curio, FNP  Date: 03/24/2023 11:34 AM   Virtual Visit via Video Note   I, Rhonda Hurst, connected with  Rhonda Hurst  ([id number], 04-15-1980) on 03/24/23 at 11:30 AM EDT by a video-enabled telemedicine application and verified that I am speaking with the correct person using two identifiers.  Location: Patient: Virtual Visit Location Patient:  Home Provider: Virtual Visit Location Provider: Home Office   I discussed the limitations of evaluation and management by telemedicine and the availability of in person appointments. The patient expressed understanding and agreed to proceed.    History of Present Illness: Rhonda Hurst is a 43 y.o. who identifies as a female who was assigned female at birth, and is being seen today for sore throat, fever, headache for 2 days. Temp 102. 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   Dyspnea 10/30/2018   Anxiety 07/20/2016   Thyroid nodule 07/20/2016    Allergies:  Allergies  Allergen Reactions   Sulfa Antibiotics    Sulfamethoxazole Rash and Hives   Medications:  Current Outpatient Medications:    albuterol (VENTOLIN HFA) 108 (90 Base) MCG/ACT inhaler, Inhale 1-2 puffs into the lungs every 6 (six) hours as needed., Disp: 6.7 g, Rfl: 0   ALPRAZolam (XANAX) 0.25 MG tablet, Take 1 tablet (0.25 mg total) by mouth 2 (two) times daily as needed for anxiety., Disp: 30 tablet, Rfl: 3   benzonatate (TESSALON) 100 MG capsule, Take 1 capsule (100 mg total) by mouth every 8 (eight) hours., Disp: 21 capsule, Rfl: 0   bisacodyl (DULCOLAX) 5 MG EC tablet, Take by mouth as directed., Disp: 4 tablet, Rfl: 0   busPIRone (BUSPAR) 15 MG tablet, Take 15 mg by mouth 2 (two) times daily., Disp: , Rfl:    chlorpheniramine-HYDROcodone (  TUSSIONEX) 10-8 MG/5ML, Take 5 mLs by mouth every 12 (twelve) hours as needed for cough., Disp: 120 mL, Rfl: 0   chlorpheniramine-HYDROcodone (TUSSIONEX) 10-8 MG/5ML, Take 5 mLs by mouth every 12 (twelve) hours as needed for cough, Disp: 120 mL, Rfl: 0   cyclobenzaprine (FLEXERIL) 10 MG tablet, Take 1 tablet (10 mg total) by mouth 2 (two) times daily as needed for muscle spasms., Disp: 20 tablet, Rfl: 0   estradiol (ESTRACE) 1 MG tablet, Take 1 tablet (1 mg total) by  mouth daily., Disp: 90 tablet, Rfl: 3   fluticasone (FLONASE) 50 MCG/ACT nasal spray, Place 2 sprays into both nostrils daily., Disp: 16 g, Rfl: 6   hydrOXYzine (ATARAX) 25 MG tablet, Take 1 tablet (25 mg total) by mouth every 8 (eight) hours as needed., Disp: 90 tablet, Rfl: 6   loratadine (CLARITIN) 10 MG tablet, Take 10 mg by mouth daily., Disp: , Rfl:    medroxyPROGESTERone (PROVERA) 2.5 MG tablet, Take 1 tablet (2.5 mg total) by mouth daily., Disp: 90 tablet, Rfl: 3   Melatonin 10 MG TABS, Take 1 tablet by mouth daily., Disp: , Rfl:    methocarbamol (ROBAXIN) 500 MG tablet, Take 1-2 tablets (500-1,000 mg total) by mouth every 6 (six) hours as needed., Disp: 120 tablet, Rfl: 3   methocarbamol (ROBAXIN) 750 MG tablet, Take 1 tablet (750 mg total) by mouth 3 (three) times daily as needed (muscle spasm/pain)., Disp: 15 tablet, Rfl: 0   methylphenidate (RITALIN) 10 MG tablet, Take 1 tablet (10 mg total) by mouth 2 (two) times daily on an empty stomach, Disp: 60 tablet, Rfl: 0   ondansetron (ZOFRAN-ODT) 4 MG disintegrating tablet, Take 1 tablet (4 mg total) by mouth every 8 (eight) hours as needed., Disp: 20 tablet, Rfl: 0   polyethylene glycol-electrolytes (NULYTELY) 420 g solution, use as directed, Disp: 4000 mL, Rfl: 0   predniSONE (STERAPRED UNI-PAK 21 TAB) 5 MG (21) TBPK tablet, Take 6 tablets (30 mg total) by mouth daily for 1 day, THEN 5 tablets (25 mg total) daily for 1 day, THEN 4 tablets (20 mg total) daily for 1 day, THEN 3 tablets (15 mg total) daily for 1 day, THEN 2 tablets (10 mg total) daily for 1 day, THEN 1 tablet (5 mg total) daily for 1 day., Disp: 21 tablet, Rfl: 0   traMADol (ULTRAM) 50 MG tablet, Take 1 tablet (50 mg total) by mouth every 8 (eight) hours as needed., Disp: 10 tablet, Rfl: 0   triamcinolone ointment (KENALOG) 0.5 %, Apply 1 application topically 2 (two) times daily as needed., Disp: 30 g, Rfl: 0  Observations/Objective: Patient is well-developed, well-nourished in  no acute distress.  Resting comfortably  at home.  Head is normocephalic, atraumatic.  No labored breathing.  Speech is clear and coherent with logical content.  Patient is alert and oriented at baseline.   Assessment and Plan: 1. Pharyngitis due to Streptococcus species (Primary)  2. Fever, unspecified fever cause  Increase fluids, warm salt water gargles, tylenol or ibuprofen as directed, UC as needed.   Follow Up Instructions: I discussed the assessment and treatment plan with the patient. The patient was provided an opportunity to ask questions and all were answered. The patient agreed with the plan and demonstrated an understanding of the instructions.  A copy of instructions were sent to the patient via MyChart unless otherwise noted below.     The patient was advised to call back or seek an in-person evaluation if the symptoms  worsen or if the condition fails to improve as anticipated.    Rhonda Curio, FNP

## 2023-03-24 NOTE — Patient Instructions (Signed)

## 2023-04-11 ENCOUNTER — Other Ambulatory Visit (HOSPITAL_COMMUNITY): Payer: Self-pay

## 2023-04-12 ENCOUNTER — Other Ambulatory Visit (HOSPITAL_COMMUNITY): Payer: Self-pay

## 2023-04-12 MED ORDER — ALPRAZOLAM 0.25 MG PO TABS
0.2500 mg | ORAL_TABLET | Freq: Two times a day (BID) | ORAL | 3 refills | Status: DC | PRN
  Filled 2023-04-12: qty 30, 15d supply, fill #0
  Filled 2023-05-23: qty 30, 15d supply, fill #1
  Filled 2023-07-05: qty 30, 15d supply, fill #2
  Filled 2023-08-09: qty 30, 15d supply, fill #3

## 2023-05-23 ENCOUNTER — Other Ambulatory Visit: Payer: Self-pay

## 2023-07-06 ENCOUNTER — Other Ambulatory Visit: Payer: Self-pay

## 2023-07-11 ENCOUNTER — Encounter (HOSPITAL_BASED_OUTPATIENT_CLINIC_OR_DEPARTMENT_OTHER): Payer: Self-pay

## 2023-07-11 ENCOUNTER — Ambulatory Visit (HOSPITAL_BASED_OUTPATIENT_CLINIC_OR_DEPARTMENT_OTHER)
Admission: RE | Admit: 2023-07-11 | Discharge: 2023-07-11 | Disposition: A | Discharge status: Home / Self Care | Source: Ambulatory Visit | Attending: Family Medicine | Admitting: Family Medicine

## 2023-07-11 VITALS — BP 118/78 | HR 79 | Temp 98.4°F | Resp 20

## 2023-07-11 DIAGNOSIS — J0110 Acute frontal sinusitis, unspecified: Secondary | ICD-10-CM | POA: Diagnosis not present

## 2023-07-11 MED ORDER — CEFDINIR 300 MG PO CAPS: 300.0000 mg | ORAL_CAPSULE | Freq: Two times a day (BID) | ORAL | 0 refills | Status: AC

## 2023-07-11 MED ORDER — DEXAMETHASONE SODIUM PHOSPHATE 10 MG/ML IJ SOLN
10.0000 mg | Freq: Once | INTRAMUSCULAR | Status: AC
  Administered 2023-07-11: 10 mg via INTRAMUSCULAR

## 2023-07-11 NOTE — ED Provider Notes (Signed)
 [facility]    CSN: [id number] Arrival date & time: 07/11/23  1749      History   Chief Complaint Chief Complaint  Patient presents with   Sore Throat   Cough    HPI Rhonda Hurst is a 43 y.o. female.   43 year old female with onset last Thursday of sore throat, sinus congestion. States saw blisters to back of throat. This has improved. Had a full prescription  of amoxicillin  and started taking it Saturday and prednisone  20mg  daily. States throat no longer has blisters, wakes up with severe sore throat and settling into chest. Feels like overall she is worsening despite taking the medication. No fever.   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      Home Medications    Prior to Admission medications   Medication Sig Start Date End Date Taking? Authorizing Provider  cefdinir (OMNICEF) 300 MG capsule Take 1 capsule (300 mg total) by mouth 2 (two) times daily for 7 days. 07/11/23 07/18/23 Yes Zaylyn Bergdoll A, FNP  albuterol  (VENTOLIN  HFA) 108 (90 Base) MCG/ACT inhaler Inhale 1-2 puffs into the lungs every 6 (six) hours as needed. 02/07/23   Raspet, Erin K, PA-C  ALPRAZolam  (XANAX ) 0.25 MG tablet Take 1 tablet (0.25 mg total) by mouth 2 (two) times daily as needed for anxiety. 04/12/23     bisacodyl  (DULCOLAX) 5 MG EC tablet Take by mouth as directed. 09/13/22     chlorpheniramine-HYDROcodone (TUSSIONEX) 10-8 MG/5ML Take 5 mLs by mouth every 12 (twelve) hours as needed for cough 02/08/23     estradiol  (ESTRACE ) 1 MG tablet Take 1  tablet (1 mg total) by mouth daily. 09/02/22     fluticasone  (FLONASE ) 50 MCG/ACT nasal spray Place 2 sprays into both nostrils daily. 11/18/22   Kennyth Domino, FNP  loratadine (CLARITIN) 10 MG tablet Take 10 mg by mouth daily.    [provider]  medroxyPROGESTERone  (PROVERA ) 2.5 MG tablet Take 1 tablet (2.5 mg total) by mouth daily. 09/02/22     Melatonin 10 MG TABS Take 1 tablet by mouth daily.    [provider]  methocarbamol  (ROBAXIN ) 500 MG tablet Take 1-2 tablets (500-1,000 mg total) by mouth every 6 (six) hours as needed. 03/21/23     methocarbamol  (ROBAXIN ) 750 MG tablet Take 1 tablet (750 mg total) by mouth 3 (three) times daily as needed (muscle spasm/pain). 03/05/23   Steinl, Kevin, MD  methylphenidate  (RITALIN ) 10 MG tablet Take 1 tablet (10 mg total) by mouth 2 (two) times daily on an empty stomach 07/20/22     ondansetron  (ZOFRAN -ODT) 4 MG disintegrating tablet Take 1 tablet (4 mg total) by mouth every 8 (eight) hours as needed. 11/14/22   Vivienne Delon HERO, PA-C  polyethylene glycol-electrolytes (NULYTELY) 420 g solution use as directed 09/13/22     triamcinolone  ointment (KENALOG ) 0.5 % Apply 1 application topically 2 (two) times daily as needed. 05/27/21  Jeannetta Lonni ORN, MD    Family History Family History  Problem Relation Age of Onset   Hyperlipidemia Mother    Skin cancer Mother    Colon cancer Father    Non-Hodgkin's lymphoma Father     Social History Social History   Tobacco Use   Smoking status: Former    Current packs/day: 0.00    Average packs/day: 0.1 packs/day for 5.0 years (0.5 ttl pk-yrs)    Types: Cigarettes    Start date: 2008    Quit date: 2013    Years since quitting: 12.5    Passive exposure: Never   Smokeless tobacco: Never   Tobacco comments:    1 cig daily  Vaping Use   Vaping status: Never Used  Substance Use Topics   Alcohol use: Yes    Comment: Socially   Drug use: Never     Allergies   Sulfa antibiotics and  Sulfamethoxazole   Review of Systems Review of Systems  Respiratory:  Positive for cough.    See HPI  Physical Exam Triage Vital Signs ED Triage Vitals  Encounter Vitals Group     BP 07/11/23 1757 118/78     Girls Systolic BP Percentile --      Girls Diastolic BP Percentile --      Boys Systolic BP Percentile --      Boys Diastolic BP Percentile --      Pulse Rate 07/11/23 1757 79     Resp 07/11/23 1757 20     Temp 07/11/23 1757 98.4 F (36.9 C)     Temp Source 07/11/23 1757 Oral     SpO2 07/11/23 1757 96 %     Weight --      Height --      Head Circumference --      Peak Flow --      Pain Score 07/11/23 1759 5     Pain Loc --      Pain Education --      Exclude from Growth Chart --    No data found.  Updated Vital Signs BP 118/78 (BP Location: Right Arm)   Pulse 79   Temp 98.4 F (36.9 C) (Oral)   Resp 20   SpO2 96%   Visual Acuity Right Eye Distance:   Left Eye Distance:   Bilateral Distance:    Right Eye Near:   Left Eye Near:    Bilateral Near:     Physical Exam Constitutional:      General: She is not in acute distress.    Appearance: Normal appearance. She is not ill-appearing, toxic-appearing or diaphoretic.  HENT:     Head: Normocephalic and atraumatic.     Right Ear: Tympanic membrane and ear canal normal.     Left Ear: Tympanic membrane and ear canal normal.     Nose: Congestion and rhinorrhea present.     Mouth/Throat:     Pharynx: Oropharynx is clear. Posterior oropharyngeal erythema present.   Eyes:     Conjunctiva/sclera: Conjunctivae normal.    Cardiovascular:     Rate and Rhythm: Normal rate and regular rhythm.     Pulses: Normal pulses.     Heart sounds: Normal heart sounds.  Pulmonary:     Effort: Pulmonary effort is normal.     Breath sounds: Normal breath sounds.   Skin:    General: Skin is warm and dry.   Neurological:     Mental Status: She is alert.   Psychiatric:  Mood and Affect: Mood normal.       UC Treatments / Results  Labs (all labs ordered are listed, but only abnormal results are displayed) Labs Reviewed - No data to display  EKG   Radiology No results found.  Procedures Procedures (including critical care time)  Medications Ordered in UC Medications  dexamethasone  (DECADRON ) injection 10 mg (10 mg Intramuscular Given 07/11/23 1816)    Initial Impression / Assessment and Plan / UC Course  I have reviewed the triage vital signs and the nursing notes.  Pertinent labs & imaging results that were available during my care of the patient were reviewed by me and considered in my medical decision making (see chart for details).     Sinusitis with severe sore throat- no specific concerns on exam.Pt worried because she is not getting better with the medication she has.  Will switch to cefdinir and give her steroid injection here today. Hopefully this will resolve the issues. If the problem remains she will need to see her PCP and possibly ENT. She can continue OTC meds as needed.  Final Clinical Impressions(s) / UC Diagnoses   Final diagnoses:  Acute non-recurrent frontal sinusitis     Discharge Instructions      Take the antibiotics as prescribed Steroid injection given here today.  Continue with OTC medications as needed.  Follow up as needed with PCP for continued issues.     ED Prescriptions     Medication Sig Dispense Auth. Provider   cefdinir (OMNICEF) 300 MG capsule Take 1 capsule (300 mg total) by mouth 2 (two) times daily for 7 days. 14 capsule Adah Corning A, FNP      PDMP not reviewed this encounter.   Adah Corning LABOR, FNP 07/11/23 [phone]

## 2023-07-11 NOTE — ED Triage Notes (Signed)
 Onset last Thursday of sore throat, sinus congestion. States saw blisters to back of throat. Had a full script of amoxicillin  and started taking it Saturday and prednisone  20mg  daily. States throat no longer has blisters, wakes up with severe sore throat and settling into chest.

## 2023-07-11 NOTE — Discharge Instructions (Signed)
 Take the antibiotics as prescribed Steroid injection given here today.  Continue with OTC medications as needed.  Follow up as needed with PCP for continued issues.

## 2023-07-12 ENCOUNTER — Other Ambulatory Visit (HOSPITAL_COMMUNITY): Payer: Self-pay

## 2023-08-01 ENCOUNTER — Other Ambulatory Visit: Payer: Self-pay | Admitting: Obstetrics and Gynecology

## 2023-08-01 DIAGNOSIS — Z1231 Encounter for screening mammogram for malignant neoplasm of breast: Secondary | ICD-10-CM

## 2023-08-10 ENCOUNTER — Other Ambulatory Visit: Payer: Self-pay

## 2023-08-11 ENCOUNTER — Ambulatory Visit

## 2023-08-17 ENCOUNTER — Ambulatory Visit
Admission: RE | Admit: 2023-08-17 | Discharge: 2023-08-17 | Disposition: A | Discharge status: Home / Self Care | Source: Ambulatory Visit | Attending: Obstetrics and Gynecology | Admitting: Obstetrics and Gynecology

## 2023-08-17 DIAGNOSIS — Z1231 Encounter for screening mammogram for malignant neoplasm of breast: Secondary | ICD-10-CM

## 2023-08-22 ENCOUNTER — Other Ambulatory Visit: Payer: Self-pay | Admitting: Obstetrics and Gynecology

## 2023-08-22 DIAGNOSIS — R928 Other abnormal and inconclusive findings on diagnostic imaging of breast: Secondary | ICD-10-CM

## 2023-08-26 ENCOUNTER — Ambulatory Visit
Admission: RE | Admit: 2023-08-26 | Discharge: 2023-08-26 | Disposition: A | Discharge status: Home / Self Care | Source: Ambulatory Visit | Attending: Obstetrics and Gynecology | Admitting: Obstetrics and Gynecology

## 2023-08-26 DIAGNOSIS — R928 Other abnormal and inconclusive findings on diagnostic imaging of breast: Secondary | ICD-10-CM

## 2023-08-26 DIAGNOSIS — N6002 Solitary cyst of left breast: Secondary | ICD-10-CM | POA: Diagnosis not present

## 2023-09-01 ENCOUNTER — Other Ambulatory Visit (HOSPITAL_COMMUNITY): Payer: Self-pay

## 2023-09-01 ENCOUNTER — Other Ambulatory Visit: Payer: Self-pay

## 2023-09-01 MED ORDER — MEDROXYPROGESTERONE ACETATE 2.5 MG PO TABS
2.5000 mg | ORAL_TABLET | Freq: Every day | ORAL | 3 refills | Status: DC
  Filled 2023-09-01: qty 90, 90d supply, fill #0

## 2023-09-01 MED ORDER — ESTRADIOL 1 MG PO TABS
1.0000 mg | ORAL_TABLET | Freq: Every day | ORAL | 3 refills | Status: AC
  Filled 2023-09-01: qty 90, 90d supply, fill #0

## 2023-09-05 ENCOUNTER — Other Ambulatory Visit (HOSPITAL_COMMUNITY): Payer: Self-pay

## 2023-09-05 MED ORDER — ALPRAZOLAM 0.25 MG PO TABS
0.2500 mg | ORAL_TABLET | Freq: Two times a day (BID) | ORAL | 3 refills | Status: DC | PRN
  Filled 2023-09-05: qty 30, 15d supply, fill #0
  Filled 2023-10-01: qty 30, 15d supply, fill #1
  Filled 2023-10-27: qty 30, 15d supply, fill #2
  Filled 2023-11-21: qty 30, 15d supply, fill #3

## 2023-10-02 ENCOUNTER — Other Ambulatory Visit: Payer: Self-pay

## 2023-10-05 ENCOUNTER — Other Ambulatory Visit: Payer: Self-pay

## 2023-10-05 ENCOUNTER — Other Ambulatory Visit (HOSPITAL_COMMUNITY): Payer: Self-pay

## 2023-10-05 DIAGNOSIS — Z0000 Encounter for general adult medical examination without abnormal findings: Secondary | ICD-10-CM | POA: Diagnosis not present

## 2023-10-05 DIAGNOSIS — Z6825 Body mass index (BMI) 25.0-25.9, adult: Secondary | ICD-10-CM | POA: Diagnosis not present

## 2023-10-05 DIAGNOSIS — E785 Hyperlipidemia, unspecified: Secondary | ICD-10-CM | POA: Diagnosis not present

## 2023-10-05 DIAGNOSIS — Z1331 Encounter for screening for depression: Secondary | ICD-10-CM | POA: Diagnosis not present

## 2023-10-05 DIAGNOSIS — F419 Anxiety disorder, unspecified: Secondary | ICD-10-CM | POA: Diagnosis not present

## 2023-10-05 MED ORDER — FLUOXETINE HCL 10 MG PO CAPS
10.0000 mg | ORAL_CAPSULE | Freq: Every day | ORAL | 0 refills | Status: AC
  Filled 2023-10-05: qty 30, 30d supply, fill #0

## 2023-10-06 ENCOUNTER — Other Ambulatory Visit (HOSPITAL_COMMUNITY): Payer: Self-pay

## 2023-10-06 MED ORDER — CYCLOBENZAPRINE HCL 10 MG PO TABS
10.0000 mg | ORAL_TABLET | Freq: Every evening | ORAL | 5 refills | Status: AC | PRN
  Filled 2023-10-06: qty 30, 30d supply, fill #0
  Filled 2023-12-22: qty 30, 30d supply, fill #1

## 2023-10-26 DIAGNOSIS — H524 Presbyopia: Secondary | ICD-10-CM | POA: Diagnosis not present

## 2023-10-26 DIAGNOSIS — H52223 Regular astigmatism, bilateral: Secondary | ICD-10-CM | POA: Diagnosis not present

## 2023-10-27 ENCOUNTER — Other Ambulatory Visit (HOSPITAL_COMMUNITY): Payer: Self-pay

## 2023-10-27 ENCOUNTER — Other Ambulatory Visit: Payer: Self-pay

## 2023-10-27 MED ORDER — FLUOXETINE HCL 20 MG PO CAPS
20.0000 mg | ORAL_CAPSULE | Freq: Every day | ORAL | 3 refills | Status: AC
  Filled 2023-10-27: qty 30, 30d supply, fill #0
  Filled 2023-11-21: qty 30, 30d supply, fill #1
  Filled 2023-12-22: qty 30, 30d supply, fill #2

## 2023-11-07 ENCOUNTER — Other Ambulatory Visit (HOSPITAL_COMMUNITY): Payer: Self-pay

## 2023-11-07 DIAGNOSIS — Z01419 Encounter for gynecological examination (general) (routine) without abnormal findings: Secondary | ICD-10-CM | POA: Diagnosis not present

## 2023-11-07 MED ORDER — PROGESTERONE MICRONIZED 100 MG PO CAPS
100.0000 mg | ORAL_CAPSULE | Freq: Every day | ORAL | 11 refills | Status: DC
  Filled 2023-11-07: qty 30, 30d supply, fill #0

## 2023-11-07 MED ORDER — ESTRADIOL 1 MG PO TABS
1.0000 mg | ORAL_TABLET | Freq: Every day | ORAL | 3 refills | Status: AC
  Filled 2023-11-07 – 2023-11-21 (×2): qty 90, 90d supply, fill #0
  Filled 2023-12-22: qty 90, 90d supply, fill #1

## 2023-11-21 ENCOUNTER — Other Ambulatory Visit (HOSPITAL_COMMUNITY): Payer: Self-pay

## 2023-11-21 ENCOUNTER — Other Ambulatory Visit: Payer: Self-pay

## 2023-12-04 ENCOUNTER — Other Ambulatory Visit (HOSPITAL_COMMUNITY): Payer: Self-pay

## 2023-12-04 MED ORDER — MEDROXYPROGESTERONE ACETATE 2.5 MG PO TABS
2.5000 mg | ORAL_TABLET | Freq: Every day | ORAL | 1 refills | Status: AC
  Filled 2023-12-04: qty 90, 90d supply, fill #0
  Filled 2023-12-22: qty 90, 90d supply, fill #1

## 2023-12-08 ENCOUNTER — Other Ambulatory Visit (HOSPITAL_COMMUNITY): Payer: Self-pay

## 2023-12-22 ENCOUNTER — Other Ambulatory Visit: Payer: Self-pay

## 2023-12-22 ENCOUNTER — Other Ambulatory Visit (HOSPITAL_COMMUNITY): Payer: Self-pay

## 2023-12-23 ENCOUNTER — Other Ambulatory Visit (HOSPITAL_COMMUNITY): Payer: Self-pay

## 2023-12-23 MED ORDER — ALPRAZOLAM 0.25 MG PO TABS
0.2500 mg | ORAL_TABLET | Freq: Two times a day (BID) | ORAL | 3 refills | Status: AC | PRN
  Filled 2023-12-23: qty 30, 15d supply, fill #0
  Filled 2024-01-31: qty 30, 15d supply, fill #1

## 2023-12-25 ENCOUNTER — Other Ambulatory Visit (HOSPITAL_COMMUNITY): Payer: Self-pay

## 2023-12-25 MED ORDER — ALBUTEROL SULFATE HFA 108 (90 BASE) MCG/ACT IN AERS
1.0000 | INHALATION_SPRAY | RESPIRATORY_TRACT | 11 refills | Status: AC | PRN
  Filled 2023-12-25: qty 6.7, 17d supply, fill #0

## 2024-01-08 ENCOUNTER — Other Ambulatory Visit (HOSPITAL_COMMUNITY): Payer: Self-pay

## 2024-01-08 MED ORDER — FLUOXETINE HCL 40 MG PO CAPS
40.0000 mg | ORAL_CAPSULE | Freq: Every day | ORAL | 3 refills | Status: AC
  Filled 2024-01-08: qty 30, 30d supply, fill #0
  Filled 2024-01-31 – 2024-02-01 (×2): qty 30, 30d supply, fill #1

## 2024-01-31 ENCOUNTER — Other Ambulatory Visit (HOSPITAL_BASED_OUTPATIENT_CLINIC_OR_DEPARTMENT_OTHER): Payer: Self-pay

## 2024-02-01 ENCOUNTER — Other Ambulatory Visit (HOSPITAL_BASED_OUTPATIENT_CLINIC_OR_DEPARTMENT_OTHER): Payer: Self-pay
